# Patient Record
Sex: Female | Born: 1960 | Race: White | Hispanic: No | Marital: Married | Smoking: Former smoker
Health system: Southern US, Community
[De-identification: ages and names within clinical notes are randomized; demographics above are authoritative.]

## PROBLEM LIST (undated history)

## (undated) DIAGNOSIS — Z5189 Encounter for other specified aftercare: Secondary | ICD-10-CM

## (undated) DIAGNOSIS — F32A Depression, unspecified: Secondary | ICD-10-CM

## (undated) DIAGNOSIS — D689 Coagulation defect, unspecified: Secondary | ICD-10-CM

## (undated) DIAGNOSIS — I8390 Asymptomatic varicose veins of unspecified lower extremity: Secondary | ICD-10-CM

## (undated) DIAGNOSIS — D649 Anemia, unspecified: Secondary | ICD-10-CM

## (undated) DIAGNOSIS — Z789 Other specified health status: Secondary | ICD-10-CM

## (undated) DIAGNOSIS — M1990 Unspecified osteoarthritis, unspecified site: Secondary | ICD-10-CM

## (undated) DIAGNOSIS — E119 Type 2 diabetes mellitus without complications: Secondary | ICD-10-CM

## (undated) DIAGNOSIS — T7840XA Allergy, unspecified, initial encounter: Secondary | ICD-10-CM

## (undated) HISTORY — DX: Depression, unspecified: F32.A

## (undated) HISTORY — DX: Coagulation defect, unspecified: D68.9

## (undated) HISTORY — DX: Unspecified osteoarthritis, unspecified site: M19.90

## (undated) HISTORY — DX: Encounter for other specified aftercare: Z51.89

## (undated) HISTORY — DX: Anemia, unspecified: D64.9

## (undated) HISTORY — PX: COLONOSCOPY: SHX174

## (undated) HISTORY — PX: LASER ABLATION: SHX1947

## (undated) HISTORY — PX: TUBAL LIGATION: SHX77

## (undated) HISTORY — DX: Allergy, unspecified, initial encounter: T78.40XA

## (undated) HISTORY — DX: Type 2 diabetes mellitus without complications: E11.9

## (undated) HISTORY — DX: Asymptomatic varicose veins of unspecified lower extremity: I83.90

---

## 1986-02-08 HISTORY — PX: ATHERECTOMY: SHX47

## 2008-08-02 ENCOUNTER — Other Ambulatory Visit: Admission: RE | Admit: 2008-08-02 | Discharge: 2008-08-02 | Payer: Self-pay | Admitting: Family Medicine

## 2008-08-09 ENCOUNTER — Encounter: Admission: RE | Admit: 2008-08-09 | Discharge: 2008-08-09 | Payer: Self-pay | Admitting: *Deleted

## 2009-09-02 ENCOUNTER — Ambulatory Visit (HOSPITAL_COMMUNITY): Admission: RE | Admit: 2009-09-02 | Discharge: 2009-09-02 | Payer: Self-pay | Admitting: Family Medicine

## 2011-05-25 ENCOUNTER — Other Ambulatory Visit: Payer: Self-pay | Admitting: Family Medicine

## 2011-05-25 DIAGNOSIS — Z139 Encounter for screening, unspecified: Secondary | ICD-10-CM

## 2011-06-08 ENCOUNTER — Ambulatory Visit (HOSPITAL_COMMUNITY)
Admission: RE | Admit: 2011-06-08 | Discharge: 2011-06-08 | Disposition: A | Discharge status: Home / Self Care | Payer: 59 | Source: Ambulatory Visit | Attending: Family Medicine | Admitting: Family Medicine

## 2011-06-08 DIAGNOSIS — Z139 Encounter for screening, unspecified: Secondary | ICD-10-CM

## 2011-06-08 DIAGNOSIS — Z1231 Encounter for screening mammogram for malignant neoplasm of breast: Secondary | ICD-10-CM | POA: Insufficient documentation

## 2012-07-22 ENCOUNTER — Ambulatory Visit (INDEPENDENT_AMBULATORY_CARE_PROVIDER_SITE_OTHER): Payer: 59 | Admitting: Family Medicine

## 2012-07-22 ENCOUNTER — Ambulatory Visit: Payer: 59

## 2012-07-22 VITALS — BP 116/74 | HR 71 | Temp 97.9°F | Resp 18 | Ht 65.25 in | Wt 163.0 lb

## 2012-07-22 DIAGNOSIS — M25532 Pain in left wrist: Secondary | ICD-10-CM

## 2012-07-22 DIAGNOSIS — M25539 Pain in unspecified wrist: Secondary | ICD-10-CM

## 2012-07-22 NOTE — Progress Notes (Signed)
  [facility]:  Office Visit  Chief Complaint:  Chief Complaint  Patient presents with  . Hand Pain    fell and injured left wrist and hand today    HPI: Shannon Fitzgerald is a 52 y.o. female who complains of left wrist pain [place] 10 am while volunteering for [employer]. She had some tingling. It hurts a lot to move it. Denies smoking, osteopenia or osteoporosis. She is right handed. She had a fracture/dislocation in right wrist s/p ice skating all. She is UTD on tetanus.   Past Medical History  Diagnosis Date  . Allergy    Past Surgical History  Procedure Laterality Date  . Tubal ligation     History   Social History  . Marital Status: Married    Spouse Name: N/A    Number of Children: N/A  . Years of Education: N/A   Social History Main Topics  . Smoking status: Former Games developer  . Smokeless tobacco: None  . Alcohol Use: No  . Drug Use: No  . Sexually Active: Yes   Other Topics Concern  . None   Social History Narrative  . None   History reviewed. No pertinent family history. No Known Allergies Prior to Admission medications   Not on File     ROS: The patient denies fevers, chills, night sweats, unintentional weight loss, chest pain, palpitations, wheezing, dyspnea on exertion, nausea, vomiting, abdominal pain, dysuria, hematuria, melena  All other systems have been reviewed and were otherwise negative with the exception of those mentioned in the HPI and as above.    PHYSICAL EXAM: Filed Vitals:   07/22/12 1125  BP: 116/74  Pulse: 71  Temp: 97.9 F (36.6 C)  Resp: 18   Filed Vitals:   07/22/12 1125  Height: 5' 5.25" (1.657 m)  Weight: 163 lb (73.936 kg)   Body mass index is 26.93 kg/(m^2).  General: Alert, no acute distress HEENT:  Normocephalic, atraumatic, oropharynx patent.  Cardiovascular:  Regular rate and rhythm, no rubs murmurs or gallops.  No Carotid bruits, radial pulse intact. No pedal edema.   Respiratory: Clear to auscultation bilaterally.  No wheezes, rales, or rhonchi.  No cyanosis, no use of accessory musculature GI: No organomegaly, abdomen is soft and non-tender, positive bowel sounds.  No masses. Skin: No rashes. Neurologic: Facial musculature symmetric. Psychiatric: Patient is appropriate throughout our interaction. Lymphatic: No cervical lymphadenopathy Musculoskeletal: Gait intact. 5/5 strength sensation intact Unable to do ROM due to pain and fx   LABS: No results found for this or any previous visit.   EKG/XRAY:   Primary read interpreted by Dr. Conley Rolls at [facility]. Displaced Distal radius fracture, 60 deg angulation   ASSESSMENT/PLAN: Encounter Diagnosis  Name Primary?  . Left wrist pain Yes   Distal wrist fracture will send to ortho Urgent care for reduction Spoke with Dr. Thurston Hole Patient given sling for comfort, decline rx pain meds F/u prn    Lelaina Oatis PHUONG, DO 07/22/2012 12:40 PM

## 2012-07-26 ENCOUNTER — Other Ambulatory Visit: Payer: Self-pay | Admitting: Orthopedic Surgery

## 2012-07-26 ENCOUNTER — Encounter (HOSPITAL_BASED_OUTPATIENT_CLINIC_OR_DEPARTMENT_OTHER): Payer: Self-pay | Admitting: *Deleted

## 2012-07-26 NOTE — Progress Notes (Signed)
Pt works [employer] [place]-from [place]-no interpretor needed No medical problems

## 2012-07-26 NOTE — H&P (Signed)
  Shannon Fitzgerald is an 52 y.o. female.   Chief Complaint: c/o injury to the left wrist after a fall HPI: On 07-22-12 while [place] litter in [place] she fell sustaining an outstretched hand type injury on the left. She had immediate pain and deformity. She was seen in an urgent care setting and referred to see Dr. Thurston Hole. She was noted to have a displaced distal radius fracture. She had reduction and placement in a sugar tong splint. She reports no numbness in her innervated fingers, ulnar innervated fingers or elsewhere in the arm. She has mild swelling. She is now referred for an upper extremity orthopaedic consult.     Past Medical History  Diagnosis Date  . Allergy   . Medical history non-contributory     Past Surgical History  Procedure Laterality Date  . Tubal ligation    . Atherectomy  1988    had DVT lt leg during pregnancy-clot removed in [place]    History reviewed. No pertinent family history. Social History:  reports that she has never smoked. She does not have any smokeless tobacco history on file. She reports that  drinks alcohol. She reports that she does not use illicit drugs.  Allergies: No Known Allergies  No prescriptions prior to admission    No results found for this or any previous visit (from the past 48 hour(s)).  No results found.   Pertinent items are noted in HPI.  Height 5' 5.5" (1.664 m), weight 73.483 kg (162 lb), last menstrual period 07/08/2012.  General appearance: alert Head: Normocephalic, without obvious abnormality Neck: supple, symmetrical, trachea midline Resp: clear to auscultation bilaterally Cardio: regular rate and rhythm GI: normal findings: bowel sounds normal Extremities: She is an excellent historian. Inspection of her hands reveals minor trauma to her nail bed of the left thumb but otherwise no evidence of full thickness skin wound. She has 2+ edema of her fingers. She has intact sensibility in the median, radial  and ulnar nerve distribution and full ROM of her fingers.  Her sugar tong splint is in good repair.  Review of her x-rays reveals that she has bayonet apposition of the volar cortex and radial styloid fracture fragment.  Pulses: 2+ and symmetric Skin: normal Neurologic: Grossly normal    Assessment/Plan Impression:Displaced left distal radius fracture.  Have advised use of a volar plate system. Surgery to apply a volar plate, the aftercare anticipated risks and benefits were explained in detail in the office. The patient understands a small chance that the plate would need to be removed at a later date.  Risks and benefits were discussed in detail including issues with friction on the flexor tendons.  Plan: To the OR for ORIF left distal radius.The procedure, risks,benefits and post-op course were discussed with the patient at length and they were in agreement with the plan.  DASNOIT,Shannon Fitzgerald J 07/26/2012, 4:33 PM

## 2012-07-27 ENCOUNTER — Encounter (HOSPITAL_BASED_OUTPATIENT_CLINIC_OR_DEPARTMENT_OTHER)
Admission: RE | Disposition: A | Discharge status: Home / Self Care | Payer: Self-pay | Source: Ambulatory Visit | Attending: Orthopedic Surgery

## 2012-07-27 ENCOUNTER — Encounter (HOSPITAL_BASED_OUTPATIENT_CLINIC_OR_DEPARTMENT_OTHER): Payer: Self-pay | Admitting: *Deleted

## 2012-07-27 ENCOUNTER — Encounter (HOSPITAL_BASED_OUTPATIENT_CLINIC_OR_DEPARTMENT_OTHER): Payer: Self-pay | Admitting: Anesthesiology

## 2012-07-27 ENCOUNTER — Ambulatory Visit (HOSPITAL_BASED_OUTPATIENT_CLINIC_OR_DEPARTMENT_OTHER): Payer: 59 | Admitting: Anesthesiology

## 2012-07-27 ENCOUNTER — Ambulatory Visit (HOSPITAL_BASED_OUTPATIENT_CLINIC_OR_DEPARTMENT_OTHER)
Admission: RE | Admit: 2012-07-27 | Discharge: 2012-07-27 | Disposition: A | Discharge status: Home / Self Care | Payer: 59 | Source: Ambulatory Visit | Attending: Orthopedic Surgery | Admitting: Orthopedic Surgery

## 2012-07-27 DIAGNOSIS — Z86718 Personal history of other venous thrombosis and embolism: Secondary | ICD-10-CM | POA: Insufficient documentation

## 2012-07-27 DIAGNOSIS — S52599A Other fractures of lower end of unspecified radius, initial encounter for closed fracture: Secondary | ICD-10-CM | POA: Insufficient documentation

## 2012-07-27 DIAGNOSIS — W010XXA Fall on same level from slipping, tripping and stumbling without subsequent striking against object, initial encounter: Secondary | ICD-10-CM | POA: Insufficient documentation

## 2012-07-27 HISTORY — DX: Other specified health status: Z78.9

## 2012-07-27 HISTORY — PX: ORIF RADIAL FRACTURE: SHX5113

## 2012-07-27 SURGERY — OPEN REDUCTION INTERNAL FIXATION (ORIF) RADIAL FRACTURE
Anesthesia: Regional | Site: Wrist | Laterality: Left | Wound class: Clean

## 2012-07-27 MED ORDER — BUPIVACAINE-EPINEPHRINE PF 0.5-1:200000 % IJ SOLN
INTRAMUSCULAR | Status: DC | PRN
  Administered 2012-07-27: 30 mL

## 2012-07-27 MED ORDER — DEXAMETHASONE SODIUM PHOSPHATE 10 MG/ML IJ SOLN
INTRAMUSCULAR | Status: DC | PRN
  Administered 2012-07-27: 10 mg via INTRAVENOUS

## 2012-07-27 MED ORDER — CEFAZOLIN SODIUM-DEXTROSE 2-3 GM-% IV SOLR
2.0000 g | Freq: Once | INTRAVENOUS | Status: AC
  Administered 2012-07-27: 2 g via INTRAVENOUS

## 2012-07-27 MED ORDER — CEPHALEXIN 500 MG PO CAPS: 500.0000 mg | ORAL_CAPSULE | Freq: Three times a day (TID) | ORAL | Status: DC

## 2012-07-27 MED ORDER — MIDAZOLAM HCL 2 MG/2ML IJ SOLN
1.0000 mg | INTRAMUSCULAR | Status: DC | PRN
  Administered 2012-07-27: 2 mg via INTRAVENOUS

## 2012-07-27 MED ORDER — ONDANSETRON HCL 4 MG/2ML IJ SOLN
INTRAMUSCULAR | Status: DC | PRN
  Administered 2012-07-27: 4 mg via INTRAVENOUS

## 2012-07-27 MED ORDER — LIDOCAINE HCL (CARDIAC) 20 MG/ML IV SOLN
INTRAVENOUS | Status: DC | PRN
  Administered 2012-07-27: 80 mg via INTRAVENOUS

## 2012-07-27 MED ORDER — PROPOFOL 10 MG/ML IV BOLUS
INTRAVENOUS | Status: DC | PRN
  Administered 2012-07-27: 200 mg via INTRAVENOUS

## 2012-07-27 MED ORDER — OXYCODONE HCL 5 MG/5ML PO SOLN: 5.0000 mg | Freq: Once | ORAL | Status: DC | PRN

## 2012-07-27 MED ORDER — OXYCODONE HCL 5 MG PO TABS: 5.0000 mg | ORAL_TABLET | Freq: Once | ORAL | Status: DC | PRN

## 2012-07-27 MED ORDER — FENTANYL CITRATE 0.05 MG/ML IJ SOLN
50.0000 ug | INTRAMUSCULAR | Status: DC | PRN
  Administered 2012-07-27: 100 ug via INTRAVENOUS

## 2012-07-27 MED ORDER — HYDROMORPHONE HCL 2 MG PO TABS
2.0000 mg | ORAL_TABLET | ORAL | Status: DC | PRN
Start: 2012-07-27 — End: 2014-09-05

## 2012-07-27 MED ORDER — BUPIVACAINE HCL (PF) 0.5 % IJ SOLN
INTRAMUSCULAR | Status: DC | PRN
  Administered 2012-07-27: 8 mL

## 2012-07-27 MED ORDER — HYDROMORPHONE HCL PF 1 MG/ML IJ SOLN: 0.2500 mg | INTRAMUSCULAR | Status: DC | PRN

## 2012-07-27 MED ORDER — LACTATED RINGERS IV SOLN
INTRAVENOUS | Status: DC
  Administered 2012-07-27 (×2): via INTRAVENOUS

## 2012-07-27 SURGICAL SUPPLY — 70 items
BAG DECANTER FOR FLEXI CONT (MISCELLANEOUS) IMPLANT
BANDAGE ADHESIVE 1X3 (GAUZE/BANDAGES/DRESSINGS) IMPLANT
BANDAGE ELASTIC 3 VELCRO ST LF (GAUZE/BANDAGES/DRESSINGS) ×4 IMPLANT
BANDAGE GAUZE ELAST BULKY 4 IN (GAUZE/BANDAGES/DRESSINGS) IMPLANT
BIT DRILL 2 FAST STEP (BIT) ×2 IMPLANT
BIT DRILL 2.5X4 QC (BIT) ×2 IMPLANT
BLADE MINI RND TIP GREEN BEAV (BLADE) ×2 IMPLANT
BLADE SURG 15 STRL LF DISP TIS (BLADE) ×1 IMPLANT
BLADE SURG 15 STRL SS (BLADE) ×1
BNDG ESMARK 4X9 LF (GAUZE/BANDAGES/DRESSINGS) ×2 IMPLANT
BRUSH SCRUB EZ PLAIN DRY (MISCELLANEOUS) ×2 IMPLANT
CANISTER SUCTION 1200CC (MISCELLANEOUS) ×2 IMPLANT
CLOTH BEACON ORANGE TIMEOUT ST (SAFETY) ×2 IMPLANT
CORDS BIPOLAR (ELECTRODE) ×2 IMPLANT
COVER MAYO STAND STRL (DRAPES) ×2 IMPLANT
COVER TABLE BACK 60X90 (DRAPES) ×2 IMPLANT
CUFF TOURNIQUET SINGLE 18IN (TOURNIQUET CUFF) ×2 IMPLANT
DECANTER SPIKE VIAL GLASS SM (MISCELLANEOUS) IMPLANT
DRAPE EXTREMITY T 121X128X90 (DRAPE) ×2 IMPLANT
DRAPE OEC MINIVIEW 54X84 (DRAPES) ×2 IMPLANT
DRAPE SURG 17X23 STRL (DRAPES) ×2 IMPLANT
GAUZE XEROFORM 1X8 LF (GAUZE/BANDAGES/DRESSINGS) IMPLANT
GLOVE BIOGEL M STRL SZ7.5 (GLOVE) ×4 IMPLANT
GLOVE BIOGEL PI IND STRL 6.5 (GLOVE) ×1 IMPLANT
GLOVE BIOGEL PI IND STRL 7.0 (GLOVE) ×1 IMPLANT
GLOVE BIOGEL PI IND STRL 8 (GLOVE) ×1 IMPLANT
GLOVE BIOGEL PI INDICATOR 6.5 (GLOVE) ×1
GLOVE BIOGEL PI INDICATOR 7.0 (GLOVE) ×1
GLOVE BIOGEL PI INDICATOR 8 (GLOVE) ×1
GLOVE ECLIPSE 6.5 STRL STRAW (GLOVE) ×2 IMPLANT
GLOVE ORTHO TXT STRL SZ7.5 (GLOVE) ×2 IMPLANT
GOWN BRE IMP PREV XXLGXLNG (GOWN DISPOSABLE) ×4 IMPLANT
GOWN PREVENTION PLUS XLARGE (GOWN DISPOSABLE) ×2 IMPLANT
GOWN PREVENTION PLUS XXLARGE (GOWN DISPOSABLE) ×2 IMPLANT
LOOP VESSEL MAXI BLUE (MISCELLANEOUS) IMPLANT
NEEDLE 27GAX1X1/2 (NEEDLE) IMPLANT
NS IRRIG 1000ML POUR BTL (IV SOLUTION) ×2 IMPLANT
PACK BASIN DAY SURGERY FS (CUSTOM PROCEDURE TRAY) ×2 IMPLANT
PAD CAST 3X4 CTTN HI CHSV (CAST SUPPLIES) ×1 IMPLANT
PADDING CAST ABS 4INX4YD NS (CAST SUPPLIES) ×2
PADDING CAST ABS COTTON 4X4 ST (CAST SUPPLIES) ×2 IMPLANT
PADDING CAST COTTON 3X4 STRL (CAST SUPPLIES) ×1
PEG SUBCHONDRAL SMOOTH 2.0X16 (Peg) ×2 IMPLANT
PEG SUBCHONDRAL SMOOTH 2.0X18 (Peg) ×8 IMPLANT
PLATE SHORT 24.4X51.3 LT (Plate) ×2 IMPLANT
SCREW BN 12X3.5XNS CORT TI (Screw) ×1 IMPLANT
SCREW CORT 3.5X10 LNG (Screw) ×4 IMPLANT
SCREW CORT 3.5X12 (Screw) ×1 IMPLANT
SCREW PEG LOCK 2.5X16 (Peg) ×2 IMPLANT
SCREW PEG LOCK 2.5X18 (Peg) ×4 IMPLANT
SLEEVE SCD COMPRESS KNEE MED (MISCELLANEOUS) ×2 IMPLANT
SPLINT PLASTER CAST XFAST 3X15 (CAST SUPPLIES) ×21 IMPLANT
SPLINT PLASTER XTRA FASTSET 3X (CAST SUPPLIES) ×21
SPONGE GAUZE 4X4 12PLY (GAUZE/BANDAGES/DRESSINGS) ×2 IMPLANT
STOCKINETTE 4X48 STRL (DRAPES) ×2 IMPLANT
STRIP CLOSURE SKIN 1/2X4 (GAUZE/BANDAGES/DRESSINGS) ×2 IMPLANT
SUCTION FRAZIER TIP 10 FR DISP (SUCTIONS) IMPLANT
SUT ETHIBOND 3-0 V-5 (SUTURE) IMPLANT
SUT PROLENE 3 0 PS 2 (SUTURE) ×2 IMPLANT
SUT VIC AB 0 SH 27 (SUTURE) ×2 IMPLANT
SUT VIC AB 2-0 PS2 27 (SUTURE) ×2 IMPLANT
SUT VIC AB 3-0 FS2 27 (SUTURE) ×2 IMPLANT
SUT VIC AB 4-0 BRD 54 (SUTURE) IMPLANT
SYR 3ML 23GX1 SAFETY (SYRINGE) IMPLANT
SYR BULB 3OZ (MISCELLANEOUS) ×2 IMPLANT
SYR CONTROL 10ML LL (SYRINGE) ×2 IMPLANT
TOWEL OR 17X24 6PK STRL BLUE (TOWEL DISPOSABLE) ×2 IMPLANT
TRAY DSU PREP LF (CUSTOM PROCEDURE TRAY) ×2 IMPLANT
TUBE CONNECTING 20X1/4 (TUBING) ×2 IMPLANT
UNDERPAD 30X30 INCONTINENT (UNDERPADS AND DIAPERS) ×2 IMPLANT

## 2012-07-27 NOTE — Brief Op Note (Signed)
07/27/2012  3:39 PM  PATIENT:  Shannon Fitzgerald  51 y.o. female  PRE-OPERATIVE DIAGNOSIS:  LEFT RADIUS FRACTURE  POST-OPERATIVE DIAGNOSIS:  left radius fracture  PROCEDURE:  Procedure(s): OPEN REDUCTION INTERNAL FIXATION (ORIF) RADIAL FRACTURE (Left)  SURGEON:  Surgeon(s) and Role:    * Wyn Forster., MD - Primary  PHYSICIAN ASSISTANT:   ASSISTANTS: Mallory Shirk.A-C   ANESTHESIA:   general  EBL:  Total I/O In: 1000 [I.V.:1000] Out: -   BLOOD ADMINISTERED:none  DRAINS: none   LOCAL MEDICATIONS USED: Ropivacaine plexus block  SPECIMEN:  No Specimen  DISPOSITION OF SPECIMEN:  N/A  COUNTS:  YES  TOURNIQUET:  * Missing tourniquet times found for documented tourniquets in log:  [id number] *  DICTATION: .Other Dictation: Dictation Number [id number]  PLAN OF CARE: Discharge to home after PACU  PATIENT DISPOSITION:  PACU - hemodynamically stable.   Delay start of Pharmacological VTE agent (>24hrs) due to surgical blood loss or risk of bleeding: not applicable

## 2012-07-27 NOTE — Anesthesia Preprocedure Evaluation (Signed)
Anesthesia Evaluation  Patient identified by MRN, date of birth, ID band Patient awake    Reviewed: Allergy & Precautions, H&P , NPO status , Patient's Chart, lab work & pertinent test results  Airway Mallampati: II TM Distance: >3 FB Neck ROM: Full    Dental no notable dental hx. (+) Teeth Intact and Dental Advisory Given   Pulmonary neg pulmonary ROS,  breath sounds clear to auscultation  Pulmonary exam normal       Cardiovascular negative cardio ROS  Rhythm:Regular Rate:Normal     Neuro/Psych negative neurological ROS  negative psych ROS   GI/Hepatic negative GI ROS, Neg liver ROS,   Endo/Other  negative endocrine ROS  Renal/GU negative Renal ROS  negative genitourinary   Musculoskeletal   Abdominal   Peds  Hematology negative hematology ROS (+)   Anesthesia Other Findings   Reproductive/Obstetrics negative OB ROS                          Anesthesia Physical Anesthesia Plan  ASA: I  Anesthesia Plan: General and Regional   Post-op Pain Management:    Induction: Intravenous  Airway Management Planned: LMA  Additional Equipment:   Intra-op Plan:   Post-operative Plan: Extubation in OR  Informed Consent: I have reviewed the patients History and Physical, chart, labs and discussed the procedure including the risks, benefits and alternatives for the proposed anesthesia with the patient or authorized representative who has indicated his/her understanding and acceptance.   Dental advisory given  Plan Discussed with: CRNA  Anesthesia Plan Comments:         Anesthesia Quick Evaluation  

## 2012-07-27 NOTE — Transfer of Care (Signed)
Immediate Anesthesia Transfer of Care Note  Patient: Shannon Fitzgerald  Procedure(s) Performed: Procedure(s): OPEN REDUCTION INTERNAL FIXATION (ORIF) RADIAL FRACTURE (Left)  Patient Location: PACU  Anesthesia Type:GA combined with regional for post-op pain  Level of Consciousness: awake, alert  and oriented  Airway & Oxygen Therapy: Patient Spontanous Breathing and Patient connected to face mask oxygen  Post-op Assessment: Report given to PACU RN and Post -op Vital signs reviewed and stable  Post vital signs: Reviewed and stable  Complications: No apparent anesthesia complications

## 2012-07-27 NOTE — Anesthesia Postprocedure Evaluation (Signed)
Anesthesia Post Note  Patient: Shannon Fitzgerald  Procedure(s) Performed: Procedure(s) (LRB): OPEN REDUCTION INTERNAL FIXATION (ORIF) RADIAL FRACTURE (Left)  Anesthesia type: General  Patient location: PACU  Post pain: Pain level controlled and Adequate analgesia  Post assessment: Post-op Vital signs reviewed, Patient's Cardiovascular Status Stable, Respiratory Function Stable, Patent Airway and Pain level controlled  Last Vitals:  Filed Vitals:   07/27/12 1615  BP: 126/74  Pulse: 78  Temp:   Resp: 16    Post vital signs: Reviewed and stable  Level of consciousness: awake, alert  and oriented  Complications: No apparent anesthesia complications

## 2012-07-27 NOTE — Progress Notes (Signed)
Assisted Dr. Fitzgerald with left, ultrasound guided, supraclavicular block. Side rails up, monitors on throughout procedure. See vital signs in flow sheet. Tolerated Procedure well. 

## 2012-07-27 NOTE — Anesthesia Procedure Notes (Addendum)
Anesthesia Regional Block:  Supraclavicular block  Pre-Anesthetic Checklist: ,, timeout performed, Correct Patient, Correct Site, Correct Laterality, Correct Procedure, Correct Position, site marked, Risks and benefits discussed, pre-op evaluation, post-op pain management  Laterality: Left  Prep: Maximum Sterile Barrier Precautions used and chloraprep       Needles:  Injection technique: Single-shot  Needle Type: Echogenic Stimulator Needle     Needle Length: 5cm 5 cm Needle Gauge: 22 and 22 G    Additional Needles:  Procedures: ultrasound guided (picture in chart) Supraclavicular block Narrative:  Start time: 07/27/2012 12:35 PM End time: 07/27/2012 12:45 PM Injection made incrementally with aspirations every 5 mL. Anesthesiologist: Fitzgerald,MD  Additional Notes: 2% Lidocaine skin wheel. Intercostobrachial block with 8cc of 0.5% Bupivicaine plain.  Supraclavicular block Performed by: Signa Kell C    Procedure Name: LMA Insertion Date/Time: 07/27/2012 2:43 PM Performed by: Burna Cash Pre-anesthesia Checklist: Patient identified, Emergency Drugs available, Suction available and Patient being monitored Patient Re-evaluated:Patient Re-evaluated prior to inductionOxygen Delivery Method: Circle System Utilized Preoxygenation: Pre-oxygenation with 100% oxygen Intubation Type: IV induction Ventilation: Mask ventilation without difficulty LMA: LMA inserted LMA Size: 4.0 Number of attempts: 1 Airway Equipment and Method: bite block Placement Confirmation: positive ETCO2 Tube secured with: Tape Dental Injury: Teeth and Oropharynx as per pre-operative assessment

## 2012-07-27 NOTE — Op Note (Signed)
[id number] 

## 2012-07-28 LAB — POCT HEMOGLOBIN-HEMACUE: Hemoglobin: 17.7 g/dL — ABNORMAL HIGH (ref 12.0–15.0)

## 2012-07-28 NOTE — Op Note (Signed)
Shannon Fitzgerald, [name] NO.:  [id number]  MEDICAL RECORD NO.:  [id number]  LOCATION:                               FACILITY:  [facility]  PHYSICIAN:  Katy Fitch. Markiya Keefe, M.D. DATE OF BIRTH:  07-21-1960  DATE OF PROCEDURE:  07/27/2012 DATE OF DISCHARGE:  07/27/2012                              OPERATIVE REPORT   PREOPERATIVE DIAGNOSIS:  Displaced bayonet apposition fracture of left distal radius.  POSTOPERATIVE DIAGNOSIS:  Displaced bayonet apposition fracture of left distal radius.  OPERATION:  Open reduction internal fixation of left distal radius utilizing a short 7 peg DVR plate system.  OPERATING SURGEON:  Katy Fitch. Justeen Hehr, MD  ASSISTANT:  Marveen Reeks Dasnoit, PA  ANESTHESIA:  General by LMA.  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 Thurston Hole for a displaced unstable distal radius metaphyseal fracture on the left.  Four days prior while working in a streambed, she sustained a significant injury to her left wrist.  She was seen at an urgent care facility and referred to Dr. Thurston Hole who is on orthopedic call.  He noted a displaced distal radius fracture.  A closed reduction and application of sugar-tong splint was accomplished.  Shannon Fitzgerald was then referred for Orthopedic Hand Surgery followup for probable volar plating.  She was evaluated in our office and had detailed informed consent.  We showed her models of volar plate systems.  We explained the benefits which include early mobilization and less immobilization postoperatively as well as anatomic reduction with obtaining and maintaining a proper anatomic shape of the distal radius.  After informed consent, she was brought to the operating room at this time.  DESCRIPTION OF PROCEDURE:  Shannon Fitzgerald was brought to room 1 of the [facility] and placed in supine position on the  operating table.  Following the induction of general anesthesia by LMA technique under Dr. Seward Meth direct supervision, the left arm and hand were prepped with Betadine soap and solution, sterilely draped.  A 2 g of Ancef were administered as an IV prophylactic antibiotic.  The left hand and arm were prepped with Betadine soap and solution and sterilely draped with sterile stockinette and impervious arthroscopy drapes.  The left hand and arm were exsanguinated with Esmarch bandage and an arterial tourniquet on the proximal brachium inflated to 220 mmHg.  Following a routine surgical time-out, a standard volar DVR incision was fashioned followed by release of the fascia deep to the flexor carpi radialis.  The flexor pollicis longus was retracted ulnarly and the pronator quadratus elevated.  The fracture was in bayonet apposition significantly displaced.  The fracture was rotated into pronation and debride and clot removed dorsally.  The brachioradialis was released with the [facility] blade followed by use of a Lane bone clamp to anatomically reduce the volar cortex and correct the ventral tilt.  A 7 peg DVR plate system was applied with a short shaft segment. Threaded screws were placed in the radial styloid to preserve its position due to the prior instability of the fracture.  The ulnar fixation points were all with smooth pegs.  Care was taken to control peg  length by direct palpation on the dorsal cortex.  An anatomic reduction of the radius was achieved with recovery of radial length slope and ventral tilt.  Normal articulation was noted at the distal radioulnar joint.  The wound was then thoroughly irrigated followed by repair of the brachioradialis over the distal margin of the plate.  The skin was repaired with subcutaneous 2-0 Vicryl and intradermal 3-0 Prolene.  Shannon Fitzgerald was then placed in a sugar-tong splint.  She will be discharged with prescriptions for Dilaudid  2 mg 1 or 2 tablets p.o. q.4-6 hours p.r.n. pain, 30 tablets without refill, also Keflex 500 mg 1 p.o. q.8 hours x4 days as a prophylactic antibiotic.     Katy Fitch Phung Kotas, M.D.     RVS/MEDQ  D:  07/27/2012  T:  07/28/2012  Job:  [id number]  cc:   Molly Maduro A. Thurston Hole, M.D.

## 2012-07-31 ENCOUNTER — Encounter (HOSPITAL_BASED_OUTPATIENT_CLINIC_OR_DEPARTMENT_OTHER): Payer: Self-pay | Admitting: Orthopedic Surgery

## 2012-11-17 ENCOUNTER — Encounter (HOSPITAL_COMMUNITY): Payer: 59

## 2012-11-17 ENCOUNTER — Encounter: Payer: 59 | Admitting: Vascular Surgery

## 2012-11-28 ENCOUNTER — Other Ambulatory Visit: Payer: 59

## 2012-11-28 DIAGNOSIS — Z79899 Other long term (current) drug therapy: Secondary | ICD-10-CM

## 2012-11-28 DIAGNOSIS — Z0000 Encounter for general adult medical examination without abnormal findings: Secondary | ICD-10-CM

## 2012-11-28 DIAGNOSIS — E559 Vitamin D deficiency, unspecified: Secondary | ICD-10-CM

## 2012-11-28 LAB — COMPLETE METABOLIC PANEL WITH GFR
AST: 17 U/L (ref 0–37)
Alkaline Phosphatase: 55 U/L (ref 39–117)
GFR, Est Non African American: 88 mL/min
Glucose, Bld: 93 mg/dL (ref 70–99)
Sodium: 136 mEq/L (ref 135–145)
Total Bilirubin: 0.7 mg/dL (ref 0.3–1.2)
Total Protein: 6.8 g/dL (ref 6.0–8.3)

## 2012-11-28 LAB — CBC WITH DIFFERENTIAL/PLATELET
Basophils Relative: 1 % (ref 0–1)
Eosinophils Absolute: 0.2 10*3/uL (ref 0.0–0.7)
HCT: 37.5 % (ref 36.0–46.0)
Hemoglobin: 12.8 g/dL (ref 12.0–15.0)
MCH: 30.2 pg (ref 26.0–34.0)
MCHC: 34.1 g/dL (ref 30.0–36.0)
Monocytes Absolute: 0.5 10*3/uL (ref 0.1–1.0)
Monocytes Relative: 10 % (ref 3–12)
Neutrophils Relative %: 32 % — ABNORMAL LOW (ref 43–77)

## 2012-11-28 LAB — LIPID PANEL
HDL: 52 mg/dL (ref >39)
Total CHOL/HDL Ratio: 2.7 Ratio
VLDL: 12 mg/dL (ref 0–40)

## 2012-11-28 LAB — TSH: TSH: 2.324 u[IU]/mL (ref 0.350–4.500)

## 2012-12-01 ENCOUNTER — Encounter: Payer: Self-pay | Admitting: Family Medicine

## 2012-12-01 ENCOUNTER — Ambulatory Visit (INDEPENDENT_AMBULATORY_CARE_PROVIDER_SITE_OTHER): Payer: 59 | Admitting: Family Medicine

## 2012-12-01 VITALS — BP 100/60 | HR 78 | Temp 98.3°F | Resp 18 | Ht 63.5 in | Wt 166.0 lb

## 2012-12-01 DIAGNOSIS — Z0000 Encounter for general adult medical examination without abnormal findings: Secondary | ICD-10-CM

## 2012-12-01 MED ORDER — VITAMIN D3 1.25 MG (50000 UT) PO CAPS: 1.0000 | ORAL_CAPSULE | ORAL | Status: DC

## 2012-12-01 NOTE — Progress Notes (Signed)
Subjective:    Patient ID: Shannon Fitzgerald, female    DOB: 1960-10-29, 52 y.o.   MRN: [id number]  HPI Complete physical exam. Her Pap smear was performed last year and was normal. She's not due again for 2 more years. Her colonoscopy was performed in 2013 and was normal. She is due for a mammogram and I will schedule that for her. She has had her flu shot. He has no medical concerns. Past Medical History  Diagnosis Date  . Allergy   . Medical history non-contributory    Past Surgical History  Procedure Laterality Date  . Tubal ligation    . Atherectomy  1988    had DVT lt leg during pregnancy-clot removed in [place]  . Orif radial fracture Left 07/27/2012    Procedure: OPEN REDUCTION INTERNAL FIXATION (ORIF) RADIAL FRACTURE;  Surgeon: Wyn Forster., MD;  Location: [facility];  Service: Orthopedics;  Laterality: Left;   Current Outpatient Prescriptions on File Prior to Visit  Medication Sig Dispense Refill  . cephALEXin (KEFLEX) 500 MG capsule Take 1 capsule (500 mg total) by mouth 3 (three) times daily.  12 capsule  0  . HYDROcodone-acetaminophen (NORCO/VICODIN) 5-325 MG per tablet Take 1 tablet by mouth every 4 (four) hours as needed for pain.      Marland Kitchen HYDROmorphone (DILAUDID) 2 MG tablet Take 1 tablet (2 mg total) by mouth every 4 (four) hours as needed for pain.  30 tablet  0   No current facility-administered medications on file prior to visit.   No Known Allergies History   Social History  . Marital Status: Married    Spouse Name: N/A    Number of Children: N/A  . Years of Education: N/A   Occupational History  . Not on file.   Social History Main Topics  . Smoking status: Never Smoker   . Smokeless tobacco: Not on file  . Alcohol Use: Yes     Comment: occ  . Drug Use: No  . Sexual Activity: Yes     Comment: married, 3 sons.   Other Topics Concern  . Not on file   Social History Narrative  . No narrative on file   Family History   Problem Relation Age of Onset  . Diabetes Mother   . Glaucoma Mother   . Hypertension Mother   . Diabetes Father   . Heart disease Brother 45  . Diabetes Brother       Review of Systems  All other systems reviewed and are negative.       Objective:   Physical Exam  Vitals reviewed. Constitutional: She is oriented to person, place, and time. She appears well-developed and well-nourished. No distress.  HENT:  Head: Normocephalic and atraumatic.  Right Ear: External ear normal.  Left Ear: External ear normal.  Nose: Nose normal.  Mouth/Throat: Oropharynx is clear and moist. No oropharyngeal exudate.  Eyes: Conjunctivae are normal. Pupils are equal, round, and reactive to light. Right eye exhibits no discharge. Left eye exhibits no discharge. No scleral icterus.  Neck: Normal range of motion. Neck supple. No JVD present. No tracheal deviation present. No thyromegaly present.  Cardiovascular: Normal rate, regular rhythm, normal heart sounds and intact distal pulses.  Exam reveals no gallop and no friction rub.   No murmur heard. Pulmonary/Chest: Effort normal and breath sounds normal. No stridor. No respiratory distress. She has no wheezes. She has no rales.  Abdominal: Soft. Bowel sounds are normal. She exhibits no distension  and no mass. There is no tenderness. There is no rebound and no guarding.  Musculoskeletal: Normal range of motion. She exhibits no edema and no tenderness.  Lymphadenopathy:    She has no cervical adenopathy.  Neurological: She is alert and oriented to person, place, and time. She has normal reflexes. She displays normal reflexes. No cranial nerve deficit. She exhibits normal muscle tone. Coordination normal.  Skin: Skin is warm. No rash noted. She is not diaphoretic. No erythema. No pallor.  Psychiatric: She has a normal mood and affect. Her behavior is normal. Judgment and thought content normal.   breast exam is normal        Assessment & Plan:   1. Routine general medical examination at a health care facility Scheduled patient for mammogram. Remainder medical screening and immunizations are up to date. Her most recent labwork as listed below. Is significant only for vitamin D deficiency. I started patient on vitamin D 50,000 units by mouth every week for the next 24 weeks. I did advise her to start taking 2000 units a day after that.  Otherwise her exam is normal. Her next Pap smear is due in 2 years. Recheck in one year or as needed. Lab on 11/28/2012  Component Date Value Range Status  . Sodium 11/28/2012 136  135 - 145 mEq/L Final  . Potassium 11/28/2012 4.3  3.5 - 5.3 mEq/L Final  . Chloride 11/28/2012 106  96 - 112 mEq/L Final  . CO2 11/28/2012 26  19 - 32 mEq/L Final  . Glucose, Bld 11/28/2012 93  70 - 99 mg/dL Final  . BUN 16/11/9602 11  6 - 23 mg/dL Final  . Creat 54/10/8117 0.78  0.50 - 1.10 mg/dL Final  . Total Bilirubin 11/28/2012 0.7  0.3 - 1.2 mg/dL Final  . Alkaline Phosphatase 11/28/2012 55  39 - 117 U/L Final  . AST 11/28/2012 17  0 - 37 U/L Final  . ALT 11/28/2012 18  0 - 35 U/L Final  . Total Protein 11/28/2012 6.8  6.0 - 8.3 g/dL Final  . Albumin 14/78/2956 4.1  3.5 - 5.2 g/dL Final  . Calcium 21/30/8657 9.2  8.4 - 10.5 mg/dL Final  . GFR, Est African American 11/28/2012 >89   Final  . GFR, Est Non African American 11/28/2012 88   Final   Comment:                            The estimated GFR is a calculation valid for adults (>=63 years old)                          that uses the CKD-EPI algorithm to adjust for age and sex. It is                            not to be used for children, pregnant women, hospitalized patients,                             patients on dialysis, or with rapidly changing kidney function.                          According to the NKDEP, eGFR >89 is normal, 60-89 shows mild  impairment, 30-59 shows moderate impairment, 15-29 shows severe                           impairment and <15 is ESRD.                             Marland Kitchen Cholesterol 11/28/2012 142  0 - 200 mg/dL Final   Comment: ATP III Classification:                                < 200        mg/dL        Desirable                               200 - 239     mg/dL        Borderline High                               >= 240        mg/dL        High                             . Triglycerides 11/28/2012 58  <150 mg/dL Final  . HDL 40/98/1191 52  >39 mg/dL Final  . Total CHOL/HDL Ratio 11/28/2012 2.7   Final  . VLDL 11/28/2012 12  0 - 40 mg/dL Final  . LDL Cholesterol 11/28/2012 78  0 - 99 mg/dL Final   Comment:                            Total Cholesterol/HDL Ratio:CHD Risk                                                 Coronary Heart Disease Risk Table                                                                 Men       Women                                   1/2 Average Risk              3.4        3.3                                       Average Risk              5.0        4.4  2X Average Risk              9.6        7.1                                    3X Average Risk             23.4       11.0                          Use the calculated Patient Ratio above and the CHD Risk table                           to determine the patient's CHD Risk.                          ATP III Classification (LDL):                                < 100        mg/dL         Optimal                               100 - 129     mg/dL         Near or Above Optimal                               130 - 159     mg/dL         Borderline High                               160 - 189     mg/dL         High                                > 190        mg/dL         Very High                             . TSH 11/28/2012 2.324  0.350 - 4.500 uIU/mL Final  . Vit D, 25-Hydroxy 11/28/2012 29* 30 - 89 ng/mL Final   Comment: This assay accurately quantifies Vitamin D, which is the sum of  the                          25-Hydroxy forms of Vitamin D2 and D3.  Studies have shown that the                          optimum concentration of 25-Hydroxy Vitamin D is 30 ng/mL or higher.                           Concentrations of Vitamin D between 20 and 29  ng/mL are considered to                          be insufficient and concentrations less than 20 ng/mL are considered                          to be deficient for Vitamin D.  . WBC 11/28/2012 4.6  4.0 - 10.5 K/uL Final  . RBC 11/28/2012 4.24  3.87 - 5.11 MIL/uL Final  . Hemoglobin 11/28/2012 12.8  12.0 - 15.0 g/dL Final  . HCT 16/11/9602 37.5  36.0 - 46.0 % Final  . MCV 11/28/2012 88.4  78.0 - 100.0 fL Final  . MCH 11/28/2012 30.2  26.0 - 34.0 pg Final  . MCHC 11/28/2012 34.1  30.0 - 36.0 g/dL Final  . RDW 54/10/8117 14.2  11.5 - 15.5 % Final  . Platelets 11/28/2012 190  150 - 400 K/uL Final  . Neutrophils Relative % 11/28/2012 32* 43 - 77 % Final  . Neutro Abs 11/28/2012 1.5* 1.7 - 7.7 K/uL Final  . Lymphocytes Relative 11/28/2012 53* 12 - 46 % Final  . Lymphs Abs 11/28/2012 2.4  0.7 - 4.0 K/uL Final  . Monocytes Relative 11/28/2012 10  3 - 12 % Final  . Monocytes Absolute 11/28/2012 0.5  0.1 - 1.0 K/uL Final  . Eosinophils Relative 11/28/2012 4  0 - 5 % Final  . Eosinophils Absolute 11/28/2012 0.2  0.0 - 0.7 K/uL Final  . Basophils Relative 11/28/2012 1  0 - 1 % Final  . Basophils Absolute 11/28/2012 0.0  0.0 - 0.1 K/uL Final  . Smear Review 11/28/2012 Criteria for review not met   Final    - MM Digital Screening; Future

## 2012-12-14 ENCOUNTER — Other Ambulatory Visit: Payer: Self-pay

## 2012-12-18 ENCOUNTER — Ambulatory Visit (HOSPITAL_COMMUNITY): Payer: 59

## 2013-01-02 ENCOUNTER — Ambulatory Visit (HOSPITAL_COMMUNITY)
Admission: RE | Admit: 2013-01-02 | Discharge: 2013-01-02 | Disposition: A | Discharge status: Home / Self Care | Payer: 59 | Source: Ambulatory Visit | Attending: Family Medicine | Admitting: Family Medicine

## 2013-01-02 DIAGNOSIS — Z1231 Encounter for screening mammogram for malignant neoplasm of breast: Secondary | ICD-10-CM | POA: Insufficient documentation

## 2013-01-02 DIAGNOSIS — Z0000 Encounter for general adult medical examination without abnormal findings: Secondary | ICD-10-CM

## 2014-08-28 ENCOUNTER — Other Ambulatory Visit: Payer: BLUE CROSS/BLUE SHIELD

## 2014-08-28 DIAGNOSIS — E559 Vitamin D deficiency, unspecified: Secondary | ICD-10-CM | POA: Insufficient documentation

## 2014-08-28 DIAGNOSIS — Z0000 Encounter for general adult medical examination without abnormal findings: Secondary | ICD-10-CM

## 2014-08-28 DIAGNOSIS — Z79899 Other long term (current) drug therapy: Secondary | ICD-10-CM

## 2014-08-28 LAB — COMPLETE METABOLIC PANEL WITH GFR
ALBUMIN: 4 g/dL (ref 3.5–5.2)
ALK PHOS: 55 U/L (ref 39–117)
ALT: 30 U/L (ref 0–35)
AST: 22 U/L (ref 0–37)
BUN: 10 mg/dL (ref 6–23)
CHLORIDE: 105 meq/L (ref 96–112)
CO2: 27 meq/L (ref 19–32)
Calcium: 9.5 mg/dL (ref 8.4–10.5)
Creat: 0.68 mg/dL (ref 0.50–1.10)
GFR, Est African American: >89 mL/min
GFR, Est Non African American: >89 mL/min
GLUCOSE: 100 mg/dL — AB (ref 70–99)
Potassium: 4.2 mEq/L (ref 3.5–5.3)
Sodium: 143 mEq/L (ref 135–145)
Total Bilirubin: 0.7 mg/dL (ref 0.2–1.2)
Total Protein: 7.3 g/dL (ref 6.0–8.3)

## 2014-08-28 LAB — LIPID PANEL
Cholesterol: 189 mg/dL (ref 0–200)
HDL: 58 mg/dL (ref >=46)
LDL CALC: 109 mg/dL — AB (ref 0–99)
Total CHOL/HDL Ratio: 3.3 Ratio
Triglycerides: 109 mg/dL (ref <150)
VLDL: 22 mg/dL (ref 0–40)

## 2014-08-28 LAB — CBC WITH DIFFERENTIAL/PLATELET
BASOS ABS: 0.1 10*3/uL (ref 0.0–0.1)
BASOS PCT: 1 % (ref 0–1)
EOS PCT: 5 % (ref 0–5)
Eosinophils Absolute: 0.3 10*3/uL (ref 0.0–0.7)
HCT: 40.2 % (ref 36.0–46.0)
Hemoglobin: 13.4 g/dL (ref 12.0–15.0)
LYMPHS PCT: 42 % (ref 12–46)
Lymphs Abs: 2.7 10*3/uL (ref 0.7–4.0)
MCH: 30.3 pg (ref 26.0–34.0)
MCHC: 33.3 g/dL (ref 30.0–36.0)
MCV: 91 fL (ref 78.0–100.0)
MONO ABS: 0.4 10*3/uL (ref 0.1–1.0)
MPV: 10.5 fL (ref 8.6–12.4)
Monocytes Relative: 6 % (ref 3–12)
NEUTROS ABS: 3 10*3/uL (ref 1.7–7.7)
Neutrophils Relative %: 46 % (ref 43–77)
PLATELETS: 221 10*3/uL (ref 150–400)
RBC: 4.42 MIL/uL (ref 3.87–5.11)
RDW: 14.4 % (ref 11.5–15.5)
WBC: 6.5 10*3/uL (ref 4.0–10.5)

## 2014-08-28 LAB — TSH: TSH: 2.735 u[IU]/mL (ref 0.350–4.500)

## 2014-08-29 LAB — VITAMIN D 25 HYDROXY (VIT D DEFICIENCY, FRACTURES): Vit D, 25-Hydroxy: 26 ng/mL — ABNORMAL LOW (ref 30–100)

## 2014-09-05 ENCOUNTER — Encounter: Payer: Self-pay | Admitting: Family Medicine

## 2014-09-05 ENCOUNTER — Ambulatory Visit (INDEPENDENT_AMBULATORY_CARE_PROVIDER_SITE_OTHER): Payer: BLUE CROSS/BLUE SHIELD | Admitting: Family Medicine

## 2014-09-05 VITALS — BP 130/80 | HR 64 | Temp 98.2°F | Resp 14 | Ht 64.0 in | Wt 181.0 lb

## 2014-09-05 DIAGNOSIS — I8393 Asymptomatic varicose veins of bilateral lower extremities: Secondary | ICD-10-CM

## 2014-09-05 DIAGNOSIS — Z0000 Encounter for general adult medical examination without abnormal findings: Secondary | ICD-10-CM

## 2014-09-05 DIAGNOSIS — Z23 Encounter for immunization: Secondary | ICD-10-CM | POA: Diagnosis not present

## 2014-09-05 DIAGNOSIS — I8390 Asymptomatic varicose veins of unspecified lower extremity: Secondary | ICD-10-CM

## 2014-09-05 NOTE — Progress Notes (Signed)
Subjective:    Patient ID: Shannon Fitzgerald, female    DOB: [birth year], 54 y.o.   MRN: [id number]  HPI Patient is here today for complete physical exam. Last colonoscopy was in 2014. She is due for a mammogram as well as a Pap smear. She is requesting a referral to a GYN office for these test to be performed there. She is also requesting a referral to the vein in vascular clinic for varicose veins in her leg. She has several medium-sized varicosities worse in the left leg particularly in the left calf and left shin. There is no ulcer. There is no evidence of thrombophlebitis. Patient is wearing knee-high compression stockings on a daily basis. The symptoms seem to be getting worse. I explained to the patient that there is no current surgical issues but she would still like to speak with a vein in vascular clinic for second opinion Past Medical History  Diagnosis Date  . Allergy   . Medical history non-contributory    Past Surgical History  Procedure Laterality Date  . Tubal ligation    . Atherectomy  1988    had DVT lt leg during pregnancy-clot removed in [place]  . Orif radial fracture Left 07/27/2012    Procedure: OPEN REDUCTION INTERNAL FIXATION (ORIF) RADIAL FRACTURE;  Surgeon: Cammie Sickle., MD;  Location: [facility];  Service: Orthopedics;  Laterality: Left;   No current outpatient prescriptions on file prior to visit.   No current facility-administered medications on file prior to visit.   No Known Allergies History   Social History  . Marital Status: Married    Spouse Name: N/A  . Number of Children: N/A  . Years of Education: N/A   Occupational History  . Not on file.   Social History Main Topics  . Smoking status: Never Smoker   . Smokeless tobacco: Not on file  . Alcohol Use: Yes     Comment: occ  . Drug Use: No  . Sexual Activity: Yes     Comment: married, 3 sons.   Other Topics Concern  . Not on file   Social History Narrative    Family History  Problem Relation Age of Onset  . Diabetes Mother   . Glaucoma Mother   . Hypertension Mother   . Diabetes Father   . Heart disease Brother 29  . Diabetes Brother      Review of Systems  All other systems reviewed and are negative.      Objective:   Physical Exam  Constitutional: She is oriented to person, place, and time. She appears well-developed and well-nourished. No distress.  HENT:  Head: Normocephalic and atraumatic.  Right Ear: External ear normal.  Left Ear: External ear normal.  Nose: Nose normal.  Mouth/Throat: Oropharynx is clear and moist. No oropharyngeal exudate.  Eyes: Conjunctivae and EOM are normal. Pupils are equal, round, and reactive to light. Right eye exhibits no discharge. Left eye exhibits no discharge. No scleral icterus.  Neck: Normal range of motion. Neck supple. No JVD present. No tracheal deviation present. No thyromegaly present.  Cardiovascular: Normal rate, regular rhythm and intact distal pulses.  Exam reveals no gallop and no friction rub.   Murmur heard. Pulmonary/Chest: Effort normal and breath sounds normal. No stridor. No respiratory distress. She has no wheezes. She has no rales. She exhibits no tenderness.  Abdominal: Soft. Bowel sounds are normal. She exhibits no distension and no mass. There is no tenderness. There is no rebound  and no guarding.  Musculoskeletal: Normal range of motion. She exhibits no edema or tenderness.  Lymphadenopathy:    She has no cervical adenopathy.  Neurological: She is alert and oriented to person, place, and time. She has normal reflexes. She displays normal reflexes. No cranial nerve deficit. She exhibits normal muscle tone. Coordination normal.  Skin: Skin is warm. No rash noted. She is not diaphoretic. No erythema. No pallor.  Psychiatric: She has a normal mood and affect. Her behavior is normal. Judgment and thought content normal.  Vitals reviewed.         Assessment & Plan:   Routine general medical examination at a health care facility - Plan: Ambulatory referral to Gynecology, Tdap vaccine greater than or equal to 7yo IM  Varicose vein of leg - Plan: Ambulatory referral to Vascular Surgery  Need for Tdap vaccination - Plan: Tdap vaccine greater than or equal to 7yo IM  Patient's physical exam today is normal except for a small 1/6 systolic ejection murmur in several medium-sized varicose veins in both legs. I will gladly refer the patient to a gynecologist for pelvic exam as well as mammogram. I recommended thousand milligrams a day of calcium and 1000 units a day of vitamin D. I reviewed her recent lab work including a CBC, CMP, vitamin D level, TSH, fasting lipid panel. The only significant finding was a vitamin D that was low. Patient received a tetanus vaccine today. Patient would like to see the vein in vascular clinic to discuss treatments for varicose veins. I explained to the patient that there is no surgical issue at the present time and that her best option would be to elevate her legs as much as possible and wear the compression stockings. Still she relates to the vein in vascular clinic for second opinion and therefore I will facilitate this referral

## 2014-09-16 ENCOUNTER — Other Ambulatory Visit: Payer: Self-pay | Admitting: *Deleted

## 2014-09-16 DIAGNOSIS — I83893 Varicose veins of bilateral lower extremities with other complications: Secondary | ICD-10-CM

## 2014-10-08 ENCOUNTER — Encounter: Payer: Self-pay | Admitting: *Deleted

## 2014-11-29 ENCOUNTER — Encounter: Payer: Self-pay | Admitting: Vascular Surgery

## 2014-12-03 ENCOUNTER — Encounter (HOSPITAL_COMMUNITY): Payer: 59

## 2014-12-03 ENCOUNTER — Ambulatory Visit (HOSPITAL_COMMUNITY)
Admission: RE | Admit: 2014-12-03 | Discharge: 2014-12-03 | Disposition: A | Discharge status: Home / Self Care | Payer: BLUE CROSS/BLUE SHIELD | Source: Ambulatory Visit | Attending: Vascular Surgery | Admitting: Vascular Surgery

## 2014-12-03 ENCOUNTER — Encounter: Payer: 59 | Admitting: Vascular Surgery

## 2014-12-03 ENCOUNTER — Ambulatory Visit (INDEPENDENT_AMBULATORY_CARE_PROVIDER_SITE_OTHER): Payer: BLUE CROSS/BLUE SHIELD | Admitting: Vascular Surgery

## 2014-12-03 ENCOUNTER — Encounter: Payer: Self-pay | Admitting: Vascular Surgery

## 2014-12-03 VITALS — BP 136/82 | HR 55 | Ht 64.0 in | Wt 184.1 lb

## 2014-12-03 DIAGNOSIS — I83893 Varicose veins of bilateral lower extremities with other complications: Secondary | ICD-10-CM | POA: Diagnosis present

## 2014-12-03 NOTE — Progress Notes (Signed)
Subjective:     Patient ID: Shannon Fitzgerald, female   DOB: [birth year], 54 y.o.   MRN: [id number]  HPI This 54 year old female is evaluated for painful varicosities in both legs. She has developed progressive swelling from the knee to the foot bilaterally left worse than right over the last few years. 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 She had a history of DVT in the left leg 28 years ago which was treated with thrombectomy and she has had no swelling in the left leg up until the last few months. She has no history of stasis ulcers, bleeding, or superficial thrombophlebitis. She has tried short last docking's which have not relieved her smptoms. Symptoms are beginning to affect her daily living and ability to work.  Past Medical History  Diagnosis Date  . Allergy   . Medical history non-contributory     Social History  Substance Use Topics  . Smoking status: Former Smoker    Quit date: 02/09/1980  . Smokeless tobacco: Not on file  . Alcohol Use: 0.0 oz/week    0 Standard drinks or equivalent per week     Comment: occ    Family History  Problem Relation Age of Onset  . Diabetes Mother   . Glaucoma Mother   . Hypertension Mother   . Diabetes Father   . Heart disease Brother 52  . Diabetes Brother     No Known Allergies   Current outpatient prescriptions:  .  Cholecalciferol (VITAMIN D) 2000 UNITS tablet, Take 2,000 Units by mouth daily., Disp: , Rfl:   Filed Vitals:   12/03/14 1511  BP: 136/82  Pulse: 55  Height: 5\' 4"  (1.626 m)  Weight: 184 lb 1.6 oz (83.507 kg)  SpO2: 99%    Body mass index is 31.59 kg/(m^2).           Review of Systems  Denies chest pain, dyspnea on exertion, PND, orthopnea, hemoptysis. All systems negative and complete review of systems     Objective:   Physical Exam BP 136/82 mmHg  Pulse 55  Ht 5\' 4"  (1.626 m)  Wt 184 lb 1.6 oz (83.507 kg)  BMI 31.59 kg/m2  SpO2 99%  Gen.-alert and oriented x3 in no  apparent distress HEENT normal for age Lungs no rhonchi or wheezing Cardiovascular regular rhythm no murmurs carotid pulses 3+ palpable no bruits audible Abdomen soft nontender no palpable masses Musculoskeletal free of  major deformities Skin clear -no rashes Neurologic normal Lower extremities 3+ femoral and dorsalis pedis pulses palpable bilaterally with  1+ edema bilaterally Bulging varicosities left leg beginning in the distal thigh extending to the medial malleolus with some  Posterior varicosities as well Right leg with bulging varicosities medial calf to the ankle level with moderate amount of bulging varicosities posterior calf. No hyperpigmentation bilaterally and no active ulceration but edema-1+ bilaterally.  Today I ordered bilateral venous duplex exam which I reviewed and interpreted. There is no DVT in either leg. Right leg has gross reflux in the great saphenous vein and the small saphenous vein large caliber in the left leg has reflux in the great saphenous vein       Assessment:      #1 painful varicosities bilateral with chronic edema due to gross reflux right great and small saphenous veins and left great saphenous vein -symptoms are beginning to affect patient's daily living and ability to work     Plan:         #  1 long leg elastic compression stockings 20-30 mm gradient #2 elevate legs as much as possible #3 ibuprofen daily on a regular basis for pain #4 return in 3 months-if no significant improvement then  She will need #1 laser ablation left great saphenous vein plus greater than 20 stab phlebectomy followed by #2 laser ablation right small saphenous vein followed by #3 laser ablation right great saphenous vein plus greater than 20 stab phlebectomy  Return in 3 months for further evaluation

## 2015-02-25 ENCOUNTER — Encounter: Payer: Self-pay | Admitting: Vascular Surgery

## 2015-03-04 ENCOUNTER — Ambulatory Visit (INDEPENDENT_AMBULATORY_CARE_PROVIDER_SITE_OTHER): Payer: BLUE CROSS/BLUE SHIELD | Admitting: Vascular Surgery

## 2015-03-04 ENCOUNTER — Encounter: Payer: Self-pay | Admitting: Vascular Surgery

## 2015-03-04 VITALS — BP 130/75 | HR 65 | Temp 97.8°F | Resp 16 | Ht 64.0 in | Wt 192.0 lb

## 2015-03-04 DIAGNOSIS — I83893 Varicose veins of bilateral lower extremities with other complications: Secondary | ICD-10-CM

## 2015-03-04 NOTE — Progress Notes (Signed)
Subjective:     Patient ID: Shannon Fitzgerald, female   DOB: [birth year], 55 y.o.   MRN: [id number]  HPI this 55 year old female returns for continued follow-up regarding her painful varicosities in both legs. 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 Left is slightly worse than the right. She has no history of DVT thrombophlebitis stasis ulcers or bleeding. She has been wearing long leg elastic compression stockings 20-30 millimeter gradient and trying elevation and ibuprofen with no improvement in her symptoms. She is unable to elevate her legs while at work. This is affecting her daily living and ability to work.  Past Medical History  Diagnosis Date  . Allergy   . Medical history non-contributory     Social History  Substance Use Topics  . Smoking status: Former Smoker    Quit date: 02/09/1980  . Smokeless tobacco: Not on file  . Alcohol Use: 0.0 oz/week    0 Standard drinks or equivalent per week     Comment: occ    Family History  Problem Relation Age of Onset  . Diabetes Mother   . Glaucoma Mother   . Hypertension Mother   . Diabetes Father   . Heart disease Brother 10  . Diabetes Brother     No Known Allergies   Current outpatient prescriptions:  .  Cholecalciferol (VITAMIN D) 2000 UNITS tablet, Take 2,000 Units by mouth daily., Disp: , Rfl:   Filed Vitals:   03/04/15 0906  BP: 130/75  Pulse: 65  Temp: 97.8 F (36.6 C)  Resp: 16  Height: 5\' 4"  (1.626 m)  Weight: 192 lb (87.091 kg)  SpO2: 99%    Body mass index is 32.94 kg/(m^2).         Review of Systems denies chest pain, dyspnea on exertion, PND, orthopnea, hemoptysis     Objective:   Physical Exam BP 130/75 mmHg  Pulse 65  Temp(Src) 97.8 F (36.6 C)  Resp 16  Ht 5\' 4"  (1.626 m)  Wt 192 lb (87.091 kg)  BMI 32.94 kg/m2  SpO2 99%  Gen. well-developed well-nourished female in no apparent  distress alert and oriented 3 Lungs no rhonchi or wheezing Left leg with extensive bulging varicosities in the medial thigh and medial calf and posterior calf extending down to the ankle with 1+ edema. No active ulcer noted. 3+ dorsalis pedis pulse palpable. Right leg with bulging varicosities in the medial calf posterior calf and down to the medial malleolar area. 1+ edema present. 3+ dorsalis pedis pulse palpable.  Formal venous duplex exam at last visit revealed gross reflux and large caliber veins-right great saphenous and right small saphenous as well as gross reflux in left great saphenous vein supplying these painful varicosities     Assessment:     Painful varicosities with chronic progressive edema bilaterally due to gross reflux right great saphenous and small saphenous veins and left great saphenous vein. Symptoms are resistant to conservative measures. 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    Plan:     Patient needs a #1 laser ablation left great saphenous vein plus greater than 20 stab phlebectomy #2 laser ablation right small saphenous vein followed by #3 laser ablation right great saphenous vein plus greater than 20 stab phlebectomy  We will proceed with precertification to perform this in  the near future and hopefully eliminate her symptoms and improve her ability to continue working

## 2015-03-18 ENCOUNTER — Other Ambulatory Visit: Payer: Self-pay | Admitting: *Deleted

## 2015-03-18 DIAGNOSIS — I83893 Varicose veins of bilateral lower extremities with other complications: Secondary | ICD-10-CM

## 2015-04-28 ENCOUNTER — Other Ambulatory Visit: Payer: BLUE CROSS/BLUE SHIELD | Admitting: Vascular Surgery

## 2015-05-02 ENCOUNTER — Encounter: Payer: Self-pay | Admitting: Vascular Surgery

## 2015-05-05 ENCOUNTER — Encounter (HOSPITAL_COMMUNITY): Payer: BLUE CROSS/BLUE SHIELD

## 2015-05-05 ENCOUNTER — Ambulatory Visit: Payer: BLUE CROSS/BLUE SHIELD | Admitting: Vascular Surgery

## 2015-05-09 ENCOUNTER — Encounter: Payer: Self-pay | Admitting: Vascular Surgery

## 2015-05-12 ENCOUNTER — Encounter: Payer: Self-pay | Admitting: Vascular Surgery

## 2015-05-12 ENCOUNTER — Ambulatory Visit (INDEPENDENT_AMBULATORY_CARE_PROVIDER_SITE_OTHER): Payer: BLUE CROSS/BLUE SHIELD | Admitting: Vascular Surgery

## 2015-05-12 VITALS — BP 125/82 | HR 71 | Temp 97.6°F | Resp 16 | Ht 64.0 in | Wt 192.0 lb

## 2015-05-12 DIAGNOSIS — I83892 Varicose veins of left lower extremities with other complications: Secondary | ICD-10-CM | POA: Diagnosis not present

## 2015-05-12 NOTE — Progress Notes (Signed)
Subjective:     Patient ID: Shannon Fitzgerald, female   DOB: [birth year], 55 y.o.   MRN: [id number]  HPI this 55 year old female had laser ablation left great saphenous vein from the mid thigh to near the saphenofemoral junction performed under local tumescent anesthesia plus greater than 20 stab phlebectomy of painful varicosities. She tolerated the procedure well. A total of 1001 J of energy was utilized.   Review of Systems     Objective:   Physical Exam BP 125/82 mmHg  Pulse 71  Temp(Src) 97.6 F (36.4 C)  Resp 16  Ht 5\' 4"  (1.626 m)  Wt 192 lb (87.091 kg)  BMI 32.94 kg/m2  SpO2 98%      Assessment:     Well-tolerated laser ablation left great saphenous vein plus greater than 20 stab phlebectomy of painful varicosities performed under local tumescent anesthesia    Plan:     Return in 1 week for venous duplex exam to confirm closure left great saphenous vein

## 2015-05-12 NOTE — Progress Notes (Signed)
Laser Ablation Procedure    Date: 05/12/2015   Shannon Fitzgerald DOB:Apr 24, 1960  Consent signed: Yes    Surgeon:  Dr. Nelda Severe. Kellie Simmering  Procedure: Laser Ablation: left Greater Saphenous Vein  BP 125/82 mmHg  Pulse 71  Temp(Src) 97.6 F (36.4 C)  Resp 16  Ht 5\' 4"  (1.626 m)  Wt 192 lb (87.091 kg)  BMI 32.94 kg/m2  SpO2 98%  Tumescent Anesthesia: 430 cc 0.9% NaCl with 50 cc Lidocaine HCL with 1% Epi and 15 cc 8.4% NaHCO3  Local Anesthesia: 8 cc Lidocaine HCL and NaHCO3 (ratio 2:1)  Pulsed Mode: 15 watts, 545ms delay, 1.0 duration  Total Energy1001:              Total Pulses: 67               Total Time: 1:06    Stab Phlebectomy: >20 Sites: Thigh and Calf  Patient tolerated procedure well  Notes:   Description of Procedure:  After marking the course of the secondary varicosities, the patient was placed on the operating table in the supine position, and the left leg was prepped and draped in sterile fashion.   Local anesthetic was administered and under ultrasound guidance the saphenous vein was accessed with a micro needle and guide wire; then the mirco puncture sheath was placed.  A guide wire was inserted saphenofemoral junction , followed by a 5 french sheath.  The position of the sheath and then the laser fiber below the junction was confirmed using the ultrasound.  Tumescent anesthesia was administered along the course of the saphenous vein using ultrasound guidance. The patient was placed in Trendelenburg position and protective laser glasses were placed on patient and staff, and the laser was fired at 15 watts continuous mode advancing 1-54mm/second for a total of 1001 joules.   For stab phlebectomies, local anesthetic was administered at the previously marked varicosities, and tumescent anesthesia was administered around the vessels.  Greater than 20 stab wounds were made using the tip of an 11 blade. And using the vein hook, the phlebectomies were performed using a hemostat to  avulse the varicosities.  Adequate hemostasis was achieved.     Steri strips were applied to the stab wounds and ABD pads and thigh high compression stockings were applied.  Ace wrap bandages were applied over the phlebectomy sites and at the top of the saphenofemoral junction. Blood loss was less than 15 cc.  The patient ambulated out of the operating room having tolerated the procedure well.

## 2015-05-13 ENCOUNTER — Telehealth: Payer: Self-pay | Admitting: *Deleted

## 2015-05-13 NOTE — Telephone Encounter (Signed)
Pt doing well. No pain or bleeding. Following all instructions. 

## 2015-05-16 ENCOUNTER — Encounter: Payer: Self-pay | Admitting: Vascular Surgery

## 2015-05-19 ENCOUNTER — Encounter: Payer: Self-pay | Admitting: Vascular Surgery

## 2015-05-19 ENCOUNTER — Ambulatory Visit (INDEPENDENT_AMBULATORY_CARE_PROVIDER_SITE_OTHER): Payer: Self-pay | Admitting: Vascular Surgery

## 2015-05-19 ENCOUNTER — Ambulatory Visit (HOSPITAL_COMMUNITY)
Admission: RE | Admit: 2015-05-19 | Discharge: 2015-05-19 | Disposition: A | Discharge status: Home / Self Care | Payer: BLUE CROSS/BLUE SHIELD | Source: Ambulatory Visit | Attending: Vascular Surgery | Admitting: Vascular Surgery

## 2015-05-19 VITALS — BP 102/58 | HR 63 | Temp 98.0°F | Resp 18 | Ht 64.0 in | Wt 192.0 lb

## 2015-05-19 DIAGNOSIS — I83892 Varicose veins of left lower extremities with other complications: Secondary | ICD-10-CM

## 2015-05-19 DIAGNOSIS — Z9889 Other specified postprocedural states: Secondary | ICD-10-CM | POA: Insufficient documentation

## 2015-05-19 DIAGNOSIS — I83893 Varicose veins of bilateral lower extremities with other complications: Secondary | ICD-10-CM

## 2015-05-19 NOTE — Progress Notes (Signed)
Subjective:     Patient ID: Shannon Fitzgerald, female   DOB: [birth year], 55 y.o.   MRN: [id number]  HPI this 55 year old female returns 1 week post-laser ablation left great saphenous vein plus greater than 20 stab phlebectomy of painful varicosities. She did take her ibuprofen and where her elastic compression stocking as instructed. She has had some moderate discomfort in the mid to proximal thigh which is now resolving. She has had no pain in the stab phlebectomy sites. She states that her distal edema has improved significantly since the procedure.   Review of Systems     Objective:   Physical Exam BP 102/58 mmHg  Pulse 63  Temp(Src) 98 F (36.7 C) (Oral)  Resp 18  Ht 5\' 4"  (1.626 m)  Wt 192 lb (87.091 kg)  BMI 32.94 kg/m2  SpO2 97%  Gen. well-developed well-nourished female no apparent distress alert and oriented 3 Lungs no rhonchi or wheezing Left leg with mild discomfort to deep palpation over great saphenous vein in mid to proximal thigh. Stab phlebectomy sites healing nicely. Minimal distal edema. 3+ dorsalis pedis pulse palpable.  Today I ordered a venous duplex exam the left leg which I reviewed and interpreted. There is no DVT. The great saphenous vein is totally closed with distal thigh to near the saphenofemoral junction.     Assesent:     successful laser ablation   left great saphenous vein plus multiple stab phlebectomy of painful varicosities  Return next week for similar procedure and right small saphenous vein

## 2015-05-21 ENCOUNTER — Encounter: Payer: Self-pay | Admitting: Vascular Surgery

## 2015-05-26 ENCOUNTER — Encounter: Payer: Self-pay | Admitting: Vascular Surgery

## 2015-05-26 ENCOUNTER — Other Ambulatory Visit: Payer: BLUE CROSS/BLUE SHIELD | Admitting: Vascular Surgery

## 2015-05-26 ENCOUNTER — Ambulatory Visit (INDEPENDENT_AMBULATORY_CARE_PROVIDER_SITE_OTHER): Payer: BLUE CROSS/BLUE SHIELD | Admitting: Vascular Surgery

## 2015-05-26 VITALS — BP 131/61 | HR 68 | Temp 97.8°F | Resp 16 | Ht 64.0 in | Wt 192.0 lb

## 2015-05-26 DIAGNOSIS — I83891 Varicose veins of right lower extremities with other complications: Secondary | ICD-10-CM | POA: Diagnosis not present

## 2015-05-26 NOTE — Progress Notes (Signed)
Subjective:     Patient ID: Shannon Fitzgerald, female   DOB: 1960/10/23, 55 y.o.   MRN: [id number]  HPI this 55 year old female had laser ablation of the right small saphenous vein performed under local tumescent anesthesia. A total of 628 J of energy was utilized. She tolerated the procedure well. Review of Systems     Objective:   Physical Exam BP 131/61 mmHg  Pulse 68  Temp(Src) 97.8 F (36.6 C)  Resp 16  Ht 5\' 4"  (1.626 m)  Wt 192 lb (87.091 kg)  BMI 32.94 kg/m2  SpO2 99%       Assessment:     Well-tolerated laser ablation right small saphenous vein performed under local tumescent anesthesia    Plan:     Patient will return in 1 week for venous duplex exam to confirm closure right small saphenous vein She will then have laser ablation right great saphenous vein plus multiple stab phlebectomy of painful varicosities

## 2015-05-26 NOTE — Progress Notes (Signed)
Laser Ablation Procedure    Date: 05/26/2015   Shannon Fitzgerald DOB:[birth year]  Consent signed: Yes    Surgeon:  Dr. Nelda Severe. Kellie Simmering  Procedure: Laser Ablation: right Small Saphenous Vein  BP 131/61 mmHg  Pulse 68  Temp(Src) 97.8 F (36.6 C)  Resp 16  Ht 5\' 4"  (1.626 m)  Wt 192 lb (87.091 kg)  BMI 32.94 kg/m2  SpO2 99%  Tumescent Anesthesia: 120 cc 0.9% NaCl with 50 cc Lidocaine HCL with 1% Epi and 15 cc 8.4% NaHCO3  Local Anesthesia: 1 cc Lidocaine HCL and NaHCO3 (ratio 2:1)  Pulsed Mode: 15 watts, 519ms delay, 1.0 duration  Total Energy:   628           Total Pulses:    42            Total Time: :42    Patient tolerated procedure well  Notes:   Description of Procedure:  After marking the course of the secondary varicosities, the patient was placed on the operating table in the prone position, and the right leg was prepped and draped in sterile fashion.   Local anesthetic was administered and under ultrasound guidance the saphenous vein was accessed with a micro needle and guide wire; then the mirco puncture sheath was placed.  A guide wire was inserted saphenopopliteal junction , followed by a 5 french sheath.  The position of the sheath and then the laser fiber below the junction was confirmed using the ultrasound.  Tumescent anesthesia was administered along the course of the saphenous vein using ultrasound guidance. The patient was placed in Trendelenburg position and protective laser glasses were placed on patient and staff, and the laser was fired at 15 watts continuous mode advancing 1-75mm/second for a total of 628 joules.     Steri strips were applied to the stab wounds and ABD pads and thigh high compression stockings were applied.  Ace wrap bandages were applied over the phlebectomy sites and at the top of the saphenopopliteal junction. Blood loss was less than 15 cc.  The patient ambulated out of the operating room having tolerated the procedure well.

## 2015-05-27 ENCOUNTER — Telehealth: Payer: Self-pay | Admitting: *Deleted

## 2015-05-27 NOTE — Telephone Encounter (Signed)
Pt doing well. No problems, concerns or pain. Following all instructions.

## 2015-06-02 ENCOUNTER — Ambulatory Visit (INDEPENDENT_AMBULATORY_CARE_PROVIDER_SITE_OTHER): Payer: Self-pay | Admitting: Vascular Surgery

## 2015-06-02 ENCOUNTER — Encounter: Payer: Self-pay | Admitting: Vascular Surgery

## 2015-06-02 ENCOUNTER — Ambulatory Visit (HOSPITAL_COMMUNITY)
Admission: RE | Admit: 2015-06-02 | Discharge: 2015-06-02 | Disposition: A | Discharge status: Home / Self Care | Payer: BLUE CROSS/BLUE SHIELD | Source: Ambulatory Visit | Attending: Vascular Surgery | Admitting: Vascular Surgery

## 2015-06-02 VITALS — BP 123/66 | HR 58 | Temp 97.6°F | Resp 14

## 2015-06-02 DIAGNOSIS — Z9889 Other specified postprocedural states: Secondary | ICD-10-CM | POA: Diagnosis present

## 2015-06-02 DIAGNOSIS — I83891 Varicose veins of right lower extremities with other complications: Secondary | ICD-10-CM

## 2015-06-02 DIAGNOSIS — I83893 Varicose veins of bilateral lower extremities with other complications: Secondary | ICD-10-CM | POA: Diagnosis not present

## 2015-06-02 NOTE — Progress Notes (Signed)
Subjective:     Patient ID: Shannon Fitzgerald, female   DOB: [birth year], 55 y.o.   MRN: [id number]  HPI this 55 year old female returns 1 week post-laser ablation right small saphenous vein for gross reflux with painful varicosities. She has worn her long leg elastic compression stocking and taken ibuprofen as instructed. She has had very mild discomfort in the right posterior calf and no change in her chronic mild edema. She does continue to have the painful varicosities associated with her great saphenous system.  Past Medical History  Diagnosis Date  . Allergy   . Medical history non-contributory   . Varicose veins     Social History  Substance Use Topics  . Smoking status: Former Smoker    Quit date: 02/09/1980  . Smokeless tobacco: Not on file  . Alcohol Use: 0.0 oz/week    0 Standard drinks or equivalent per week     Comment: occ    Family History  Problem Relation Age of Onset  . Diabetes Mother   . Glaucoma Mother   . Hypertension Mother   . Diabetes Father   . Heart disease Brother 28  . Diabetes Brother     No Known Allergies   Current outpatient prescriptions:  .  Cholecalciferol (VITAMIN D) 2000 UNITS tablet, Take 2,000 Units by mouth daily., Disp: , Rfl:   Filed Vitals:   06/02/15 1006  BP: 123/66  Pulse: 58  Temp: 97.6 F (36.4 C)  Resp: 14  SpO2: 97%    There is no weight on file to calculate BMI.           Review of Systems and as chest pain, dyspnea on exertion, PND, orthopnea, hemoptysis, claudication     Objective:   Physical Exam BP 123/66 mmHg  Pulse 58  Temp(Src) 97.6 F (36.4 C)  Resp 14  SpO2 97%  Gen. well-developed well-nourished female no apparent distress alert and oriented 3 Lungs no rhonchi or wheezing Cardiovascular regular rhythm no murmurs Right leg with filed discomfort to palpation over the small saphenous vein in proximal calf. No distal edema noted. 3+ dorsalis pedis pulse palpable.  Today I ordered a venous  duplex exam of the right leg which I reviewed and interpreted. There is no DVT. There is total occlusion of the right small saphenous vein. Successful laser     Assessment:     Successful laser ablation right small saphenous vein for gross reflux with painful varicosities    Plan:     Patient to return in a few weeks for laser ablation right great saphenous vein plus multiple stab phlebectomy to complete her treatment regimen

## 2015-06-24 ENCOUNTER — Encounter: Payer: Self-pay | Admitting: Vascular Surgery

## 2015-06-30 ENCOUNTER — Ambulatory Visit (INDEPENDENT_AMBULATORY_CARE_PROVIDER_SITE_OTHER): Payer: BLUE CROSS/BLUE SHIELD | Admitting: Vascular Surgery

## 2015-06-30 ENCOUNTER — Other Ambulatory Visit: Payer: BLUE CROSS/BLUE SHIELD | Admitting: Vascular Surgery

## 2015-06-30 ENCOUNTER — Encounter: Payer: Self-pay | Admitting: Vascular Surgery

## 2015-06-30 VITALS — BP 132/63 | HR 72 | Temp 97.6°F | Resp 16 | Ht 64.0 in | Wt 192.0 lb

## 2015-06-30 DIAGNOSIS — I83891 Varicose veins of right lower extremities with other complications: Secondary | ICD-10-CM | POA: Diagnosis not present

## 2015-06-30 NOTE — Progress Notes (Signed)
Subjective:     Patient ID: Shannon Fitzgerald, female   DOB: 1960/02/26, 55 y.o.   MRN: [id number]  HPI this 55 year old female had laser ablation of the right great saphenous vein from the proximal calf to near the saphenofemoral junction plus greater than 20 stab phlebectomy of painful varicosities performed under local tumescent anesthesia. A total of 2320 J of energy was utilized. She tolerated the procedure well.   Review of Systems     Objective:   Physical Exam BP 132/63 mmHg  Pulse 72  Temp(Src) 97.6 F (36.4 C)  Resp 16  Ht 5\' 4"  (1.626 m)  Wt 192 lb (87.091 kg)  BMI 32.94 kg/m2  SpO2 100%       Assessment:     Well-tolerated laser ablation right great saphenous vein plus greater than 20 stab phlebectomy of painful varicosities performed under local tumescent anesthesia    Plan:     Return in 1 week for venous duplex exam to confirm closure right great saphenous vein

## 2015-06-30 NOTE — Progress Notes (Signed)
Laser Ablation Procedure    Date: 06/30/2015   Shannon Fitzgerald DOB:1961-01-31  Consent signed: Yes    Surgeon:  Dr. Nelda Severe. Kellie Simmering  Procedure: Laser Ablation: right Greater Saphenous Vein  BP 132/63 mmHg  Pulse 72  Temp(Src) 97.6 F (36.4 C)  Resp 16  Ht 5\' 4"  (1.626 m)  Wt 192 lb (87.091 kg)  BMI 32.94 kg/m2  SpO2 100%  Tumescent Anesthesia: 480 cc 0.9% NaCl with 50 cc Lidocaine HCL with 1% Epi and 15 cc 8.4% NaHCO3  Local Anesthesia: 7 cc Lidocaine HCL and NaHCO3 (ratio 2:1)  Pulsed Mode: 15 watts, 547ms delay, 1.0 duration  Total Energy: 2320             Total Pulses:    155            Total Time: 2:34    Patient tolerated procedure well  Notes:   Description of Procedure:  After marking the course of the secondary varicosities, the patient was placed on the operating table in the supine position, and the right leg was prepped and draped in sterile fashion.   Local anesthetic was administered and under ultrasound guidance the saphenous vein was accessed with a micro needle and guide wire; then the mirco puncture sheath was placed.  A guide wire was inserted saphenofemoral junction , followed by a 5 french sheath.  The position of the sheath and then the laser fiber below the junction was confirmed using the ultrasound.  Tumescent anesthesia was administered along the course of the saphenous vein using ultrasound guidance. The patient was placed in Trendelenburg position and protective laser glasses were placed on patient and staff, and the laser was fired at 15 watts continuous mode advancing 1-62mm/second for a total of 2320 joules.   For stab phlebectomies, local anesthetic was administered at the previously marked varicosities, and tumescent anesthesia was administered around the vessels.  Greater than 20 stab wounds were made using the tip of an 11 blade. And using the vein hook, the phlebectomies were performed using a hemostat to avulse the varicosities.  Adequate  hemostasis was achieved.     Steri strips were applied to the stab wounds and ABD pads and thigh high compression stockings were applied.  Ace wrap bandages were applied over the phlebectomy sites and at the top of the saphenofemoral junction. Blood loss was less than 15 cc.  The patient ambulated out of the operating room having tolerated the procedure well.

## 2015-07-01 ENCOUNTER — Encounter: Payer: Self-pay | Admitting: Vascular Surgery

## 2015-07-01 ENCOUNTER — Telehealth: Payer: Self-pay | Admitting: *Deleted

## 2015-07-01 NOTE — Telephone Encounter (Signed)
Pt had a rough night but no bleeding from the phlebectomy sites. Following all instructions. Told her it was fine to loosen the ace wraps. Will see her next week at her fu appts.

## 2015-07-02 ENCOUNTER — Encounter: Payer: Self-pay | Admitting: Vascular Surgery

## 2015-07-08 ENCOUNTER — Ambulatory Visit (HOSPITAL_COMMUNITY)
Admission: RE | Admit: 2015-07-08 | Discharge: 2015-07-08 | Disposition: A | Discharge status: Home / Self Care | Payer: BLUE CROSS/BLUE SHIELD | Source: Ambulatory Visit | Attending: Vascular Surgery | Admitting: Vascular Surgery

## 2015-07-08 ENCOUNTER — Encounter: Payer: Self-pay | Admitting: Vascular Surgery

## 2015-07-08 ENCOUNTER — Ambulatory Visit (INDEPENDENT_AMBULATORY_CARE_PROVIDER_SITE_OTHER): Payer: Self-pay | Admitting: Vascular Surgery

## 2015-07-08 VITALS — BP 140/80 | HR 64 | Temp 98.0°F | Resp 16 | Ht 64.0 in | Wt 192.0 lb

## 2015-07-08 DIAGNOSIS — I83893 Varicose veins of bilateral lower extremities with other complications: Secondary | ICD-10-CM | POA: Diagnosis not present

## 2015-07-08 DIAGNOSIS — I83891 Varicose veins of right lower extremities with other complications: Secondary | ICD-10-CM

## 2015-07-08 DIAGNOSIS — Z9889 Other specified postprocedural states: Secondary | ICD-10-CM | POA: Insufficient documentation

## 2015-07-08 NOTE — Progress Notes (Signed)
Subjective:     Patient ID: Shannon Fitzgerald, female   DOB: [birth year], 55 y.o.   MRN: [id number]  HPI this 55 year old female returns 1 week post-laser ablation right great saphenous vein plus greater than 20 stab phlebectomy of painful varicosities. This was preceded by laser ablation of the right small saphenous vein. She has experienced some moderate discomfort in the right medial thigh particularly distally around the knee level following the great saphenous procedure this is improving daily however. She's had no distal edema. She has worn her elastic compression stocking and taken ibuprofen as instructed.   Review of Systems denies chest pain, dyspnea on exertion, PND, orthopnea, hemoptysis    Objective:   Physical Exam BP 140/80 mmHg  Pulse 64  Temp(Src) 98 F (36.7 C)  Resp 16  Ht 5\' 4"  (1.626 m)  Wt 192 lb (87.091 kg)  BMI 32.94 kg/m2  SpO2 99%  Gen. well-developed well-nourished female no apparent distress alert and oriented 3 Lungs no rhonchi or wheezing Right leg with mild discomfort to palpation in the proximal thigh and moderate discomfort in the distal thigh with no erythema or blistering. Saphenous vein is palpable beneath the skin. No distal edema noted. Stab phlebectomy sites all healing nicely.  Today I ordered a venous duplex exam the right leg which I reviewed and interpreted. There is no DVT. There is total closure of the right small saphenous and great saphenous veins up to near the junctions with the deep system.     Assessment:     Successful laser ablation right great and small saphenous veins with multiple stab phlebectomy of painful varicosities    Plan:    return to see me on when necessary basis

## 2015-11-14 ENCOUNTER — Other Ambulatory Visit: Payer: BLUE CROSS/BLUE SHIELD

## 2015-11-14 DIAGNOSIS — Z0000 Encounter for general adult medical examination without abnormal findings: Secondary | ICD-10-CM

## 2015-11-14 DIAGNOSIS — E559 Vitamin D deficiency, unspecified: Secondary | ICD-10-CM

## 2015-11-14 LAB — COMPLETE METABOLIC PANEL WITH GFR
ALBUMIN: 4 g/dL (ref 3.6–5.1)
ALK PHOS: 70 U/L (ref 33–130)
ALT: 21 U/L (ref 6–29)
AST: 19 U/L (ref 10–35)
BILIRUBIN TOTAL: 0.5 mg/dL (ref 0.2–1.2)
BUN: 13 mg/dL (ref 7–25)
CALCIUM: 9.4 mg/dL (ref 8.6–10.4)
CO2: 27 mmol/L (ref 20–31)
Chloride: 104 mmol/L (ref 98–110)
Creat: 0.77 mg/dL (ref 0.50–1.05)
GFR, Est Non African American: 87 mL/min (ref >=60)
Glucose, Bld: 105 mg/dL — ABNORMAL HIGH (ref 70–99)
POTASSIUM: 4.7 mmol/L (ref 3.5–5.3)
Sodium: 140 mmol/L (ref 135–146)
TOTAL PROTEIN: 7.2 g/dL (ref 6.1–8.1)

## 2015-11-14 LAB — CBC WITH DIFFERENTIAL/PLATELET
BASOS ABS: 0 {cells}/uL (ref 0–200)
Basophils Relative: 0 %
EOS PCT: 4 %
Eosinophils Absolute: 252 cells/uL (ref 15–500)
HCT: 41.2 % (ref 35.0–45.0)
Hemoglobin: 13.6 g/dL (ref 12.0–15.0)
LYMPHS ABS: 2457 {cells}/uL (ref 850–3900)
Lymphocytes Relative: 39 %
MCH: 29.7 pg (ref 27.0–33.0)
MCHC: 33 g/dL (ref 32.0–36.0)
MCV: 90 fL (ref 80.0–100.0)
MONOS PCT: 7 %
MPV: 9.7 fL (ref 7.5–12.5)
Monocytes Absolute: 441 cells/uL (ref 200–950)
NEUTROS ABS: 3150 {cells}/uL (ref 1500–7800)
Neutrophils Relative %: 50 %
PLATELETS: 243 10*3/uL (ref 140–400)
RBC: 4.58 MIL/uL (ref 3.80–5.10)
RDW: 13.6 % (ref 11.0–15.0)
WBC: 6.3 10*3/uL (ref 3.8–10.8)

## 2015-11-14 LAB — TSH: TSH: 1.9 m[IU]/L

## 2015-11-14 LAB — LIPID PANEL
Cholesterol: 167 mg/dL (ref 125–200)
HDL: 51 mg/dL (ref >=46)
LDL CALC: 93 mg/dL (ref <130)
TRIGLYCERIDES: 114 mg/dL (ref <150)
Total CHOL/HDL Ratio: 3.3 Ratio (ref <=5.0)
VLDL: 23 mg/dL (ref <30)

## 2015-11-15 LAB — VITAMIN D 25 HYDROXY (VIT D DEFICIENCY, FRACTURES): VIT D 25 HYDROXY: 28 ng/mL — AB (ref 30–100)

## 2015-11-18 ENCOUNTER — Encounter: Payer: Self-pay | Admitting: Family Medicine

## 2015-11-18 ENCOUNTER — Ambulatory Visit (INDEPENDENT_AMBULATORY_CARE_PROVIDER_SITE_OTHER): Payer: BLUE CROSS/BLUE SHIELD | Admitting: Family Medicine

## 2015-11-18 VITALS — BP 122/68 | HR 68 | Temp 98.2°F | Resp 16 | Ht 64.0 in | Wt 198.0 lb

## 2015-11-18 DIAGNOSIS — M545 Low back pain, unspecified: Secondary | ICD-10-CM

## 2015-11-18 DIAGNOSIS — Z0000 Encounter for general adult medical examination without abnormal findings: Secondary | ICD-10-CM | POA: Diagnosis not present

## 2015-11-18 MED ORDER — CYCLOBENZAPRINE HCL 10 MG PO TABS: 10.0000 mg | ORAL_TABLET | Freq: Three times a day (TID) | ORAL | 0 refills | Status: DC | PRN

## 2015-11-18 MED ORDER — DICLOFENAC SODIUM 75 MG PO TBEC: 75.0000 mg | DELAYED_RELEASE_TABLET | Freq: Two times a day (BID) | ORAL | 0 refills | Status: DC

## 2015-11-18 NOTE — Progress Notes (Signed)
Subjective:    Patient ID: Shannon Fitzgerald, female    DOB: January 18, 1961, 55 y.o.   MRN: [id number]  HPI Patient is a very pleasant 55 year old white female who presents today for complete physical exam. She receives her gynecologic care at [facility]. Her appointment is scheduled for an later in October for her Pap smear and mammogram. Last year they were normal. Her colonoscopy is up-to-date. Her flu shot and tetanus shot are up-to-date. Her only concern is some midline to left-sided low back pain 2 weeks. It occurred gradually after lifting a heavy object at work. She denies any sciatica. She denies any numbness or weakness in her legs. She denies any saddle anesthesia or bowel or bladder incontinence. Otherwise she's been doing well with no concerns. Her recent lab work as listed below: Lab on 11/14/2015  Component Date Value Ref Range Status  . Sodium 11/14/2015 140  135 - 146 mmol/L Final  . Potassium 11/14/2015 4.7  3.5 - 5.3 mmol/L Final  . Chloride 11/14/2015 104  98 - 110 mmol/L Final  . CO2 11/14/2015 27  20 - 31 mmol/L Final  . Glucose, Bld 11/14/2015 105* 70 - 99 mg/dL Final  . BUN 11/14/2015 13  7 - 25 mg/dL Final  . Creat 11/14/2015 0.77  0.50 - 1.05 mg/dL Final   Comment:   For patients > or = 55 years of age: The upper reference limit for Creatinine is approximately 13% higher for people identified as African-American.     . Total Bilirubin 11/14/2015 0.5  0.2 - 1.2 mg/dL Final  . Alkaline Phosphatase 11/14/2015 70  33 - 130 U/L Final  . AST 11/14/2015 19  10 - 35 U/L Final  . ALT 11/14/2015 21  6 - 29 U/L Final  . Total Protein 11/14/2015 7.2  6.1 - 8.1 g/dL Final  . Albumin 11/14/2015 4.0  3.6 - 5.1 g/dL Final  . Calcium 11/14/2015 9.4  8.6 - 10.4 mg/dL Final  . GFR, Est African American 11/14/2015 >89  >=60 mL/min Final  . GFR, Est Non African American 11/14/2015 87  >=60 mL/min Final  . TSH 11/14/2015 1.90  mIU/L Final   Comment:   Reference Range   >  or = 20 Years  0.40-4.50   Pregnancy Range First trimester  0.26-2.66 Second trimester 0.55-2.73 Third trimester  0.43-2.91     . Cholesterol 11/14/2015 167  125 - 200 mg/dL Final  . Triglycerides 11/14/2015 114  <150 mg/dL Final  . HDL 11/14/2015 51  >=46 mg/dL Final  . Total CHOL/HDL Ratio 11/14/2015 3.3  <=5.0 Ratio Final  . VLDL 11/14/2015 23  <30 mg/dL Final  . LDL Cholesterol 11/14/2015 93  <130 mg/dL Final   Comment:   Total Cholesterol/HDL Ratio:CHD Risk                        Coronary Heart Disease Risk Table                                        Men       Women          1/2 Average Risk              3.4        3.3              Average Risk  5.0        4.4           2X Average Risk              9.6        7.1           3X Average Risk             23.4       11.0 Use the calculated Patient Ratio above and the CHD Risk table  to determine the patient's CHD Risk.   . WBC 11/14/2015 6.3  3.8 - 10.8 K/uL Final  . RBC 11/14/2015 4.58  3.80 - 5.10 MIL/uL Final  . Hemoglobin 11/14/2015 13.6  12.0 - 15.0 g/dL Final  . HCT 11/14/2015 41.2  35.0 - 45.0 % Final  . MCV 11/14/2015 90.0  80.0 - 100.0 fL Final  . MCH 11/14/2015 29.7  27.0 - 33.0 pg Final  . MCHC 11/14/2015 33.0  32.0 - 36.0 g/dL Final  . RDW 11/14/2015 13.6  11.0 - 15.0 % Final  . Platelets 11/14/2015 243  140 - 400 K/uL Final  . MPV 11/14/2015 9.7  7.5 - 12.5 fL Final  . Neutro Abs 11/14/2015 3150  1,500 - 7,800 cells/uL Final  . Lymphs Abs 11/14/2015 2457  850 - 3,900 cells/uL Final  . Monocytes Absolute 11/14/2015 441  200 - 950 cells/uL Final  . Eosinophils Absolute 11/14/2015 252  15 - 500 cells/uL Final  . Basophils Absolute 11/14/2015 0  0 - 200 cells/uL Final  . Neutrophils Relative % 11/14/2015 50  % Final  . Lymphocytes Relative 11/14/2015 39  % Final  . Monocytes Relative 11/14/2015 7  % Final  . Eosinophils Relative 11/14/2015 4  % Final  . Basophils Relative 11/14/2015 0  % Final  .  Smear Review 11/14/2015 Criteria for review not met   Final  . Vit D, 25-Hydroxy 11/15/2015 28* 30 - 100 ng/mL Final   Comment: Vitamin D Status           25-OH Vitamin D        Deficiency                <20 ng/mL        Insufficiency         20 - 29 ng/mL        Optimal             > or = 30 ng/mL   For 25-OH Vitamin D testing on patients on D2-supplementation and patients for whom quantitation of D2 and D3 fractions is required, the [facility] 25-OH VIT D, (D2,D3), LC/MS/MS is recommended: order code [id number] (patients > 2 yrs).    Past Medical History:  Diagnosis Date  . Allergy   . Medical history non-contributory   . Varicose veins    Past Surgical History:  Procedure Laterality Date  . ATHERECTOMY  1988   had DVT lt leg during pregnancy-clot removed in [place]  . LASER ABLATION Left   . ORIF RADIAL FRACTURE Left 07/27/2012   Procedure: OPEN REDUCTION INTERNAL FIXATION (ORIF) RADIAL FRACTURE;  Surgeon: Cammie Sickle., MD;  Location: [facility];  Service: Orthopedics;  Laterality: Left;  . TUBAL LIGATION     Current Outpatient Prescriptions on File Prior to Visit  Medication Sig Dispense Refill  . Cholecalciferol (VITAMIN D) 2000 UNITS tablet Take 2,000 Units by mouth daily.     No current facility-administered medications  on file prior to visit.    No Known Allergies Social History   Social History  . Marital status: Married    Spouse name: N/A  . Number of children: N/A  . Years of education: N/A   Occupational History  . Not on file.   Social History Main Topics  . Smoking status: Former Smoker    Quit date: 02/09/1980  . Smokeless tobacco: Not on file  . Alcohol use 0.0 oz/week     Comment: occ  . Drug use: No  . Sexual activity: Yes     Comment: married, 3 sons.   Other Topics Concern  . Not on file   Social History Narrative  . No narrative on file   Family History  Problem Relation Age of Onset  . Diabetes Mother   . Glaucoma  Mother   . Hypertension Mother   . Diabetes Father   . Heart disease Brother 61  . Diabetes Brother       Review of Systems  All other systems reviewed and are negative.      Objective:   Physical Exam  Constitutional: She is oriented to person, place, and time. She appears well-developed and well-nourished. No distress.  HENT:  Head: Normocephalic and atraumatic.  Right Ear: External ear normal.  Left Ear: External ear normal.  Nose: Nose normal.  Mouth/Throat: Oropharynx is clear and moist. No oropharyngeal exudate.  Eyes: Conjunctivae and EOM are normal. Pupils are equal, round, and reactive to light. Right eye exhibits no discharge. Left eye exhibits no discharge. No scleral icterus.  Neck: Normal range of motion. Neck supple. No JVD present. No tracheal deviation present. No thyromegaly present.  Cardiovascular: Normal rate, regular rhythm, normal heart sounds and intact distal pulses.  Exam reveals no gallop and no friction rub.   No murmur heard. Pulmonary/Chest: Effort normal and breath sounds normal. No stridor. No respiratory distress. She has no wheezes. She has no rales. She exhibits no tenderness.  Abdominal: Soft. Bowel sounds are normal. She exhibits no distension and no mass. There is no tenderness. There is no rebound and no guarding.  Musculoskeletal: Normal range of motion. She exhibits no edema, tenderness or deformity.  Lymphadenopathy:    She has no cervical adenopathy.  Neurological: She is alert and oriented to person, place, and time. She displays normal reflexes. No cranial nerve deficit. She exhibits normal muscle tone. Coordination normal.  Skin: Skin is warm. No rash noted. She is not diaphoretic. No erythema. No pallor.  Psychiatric: She has a normal mood and affect. Her behavior is normal. Judgment and thought content normal.  Vitals reviewed.         Assessment & Plan:  Midline low back pain without sciatica, unspecified chronicity - Plan:  diclofenac (VOLTAREN) 75 MG EC tablet, cyclobenzaprine (FLEXERIL) 10 MG tablet  Routine general medical examination at a health care facility  I recommended diet exercise and weight loss to address her slightly elevated blood sugar. Continue vitamin D 2000 units a day. Pap smear and mammogram will be performed at her gynecologist. Colonoscopy is up-to-date. The remainder of her lab work is excellent. Immunizations are up-to-date. We will try diclofenac 75 mg by mouth twice a day and Flexeril 10 mg by mouth daily at bedtime for her back pain. Reassess in 2-3 weeks if still present for possible imaging

## 2015-12-05 DIAGNOSIS — E669 Obesity, unspecified: Secondary | ICD-10-CM | POA: Insufficient documentation

## 2016-12-10 LAB — HM MAMMOGRAPHY

## 2017-05-16 ENCOUNTER — Other Ambulatory Visit: Payer: BLUE CROSS/BLUE SHIELD

## 2017-05-16 DIAGNOSIS — Z0000 Encounter for general adult medical examination without abnormal findings: Secondary | ICD-10-CM

## 2017-05-19 ENCOUNTER — Other Ambulatory Visit: Payer: Self-pay

## 2017-05-19 ENCOUNTER — Ambulatory Visit (INDEPENDENT_AMBULATORY_CARE_PROVIDER_SITE_OTHER): Payer: BLUE CROSS/BLUE SHIELD | Admitting: Family Medicine

## 2017-05-19 ENCOUNTER — Encounter: Payer: Self-pay | Admitting: *Deleted

## 2017-05-19 ENCOUNTER — Encounter: Payer: Self-pay | Admitting: Family Medicine

## 2017-05-19 VITALS — BP 128/72 | HR 78 | Temp 98.2°F | Resp 14 | Ht 64.0 in | Wt 206.0 lb

## 2017-05-19 DIAGNOSIS — Z114 Encounter for screening for human immunodeficiency virus [HIV]: Secondary | ICD-10-CM | POA: Diagnosis not present

## 2017-05-19 DIAGNOSIS — Z0000 Encounter for general adult medical examination without abnormal findings: Secondary | ICD-10-CM | POA: Diagnosis not present

## 2017-05-19 DIAGNOSIS — R739 Hyperglycemia, unspecified: Secondary | ICD-10-CM

## 2017-05-19 DIAGNOSIS — Z1159 Encounter for screening for other viral diseases: Secondary | ICD-10-CM | POA: Diagnosis not present

## 2017-05-19 DIAGNOSIS — E782 Mixed hyperlipidemia: Secondary | ICD-10-CM

## 2017-05-19 MED ORDER — OMEPRAZOLE 20 MG PO CPDR: 20.0000 mg | DELAYED_RELEASE_CAPSULE | Freq: Every day | ORAL | 0 refills | Status: DC

## 2017-05-19 NOTE — Progress Notes (Signed)
Subjective:    Patient ID: Shannon Fitzgerald, female    DOB: Jan 26, 1961, 57 y.o.   MRN: [id number]  HPI Patient is here today for complete physical exam. She believes her last colonoscopy was at age 3 and be due again in 2022.  Recorded differently in epic.  She gets her pap, pelvic, and mammogram at [facility].  Mammogram is due in September.  Pap was less than 3 years ago.  Most recent labs are listed below: Lab on 05/16/2017  Component Date Value Ref Range Status  . WBC 05/16/2017 7.1  3.8 - 10.8 Thousand/uL Final  . RBC 05/16/2017 4.70  3.80 - 5.10 Million/uL Final  . Hemoglobin 05/16/2017 13.8  11.7 - 15.5 g/dL Final  . HCT 05/16/2017 41.1  35.0 - 45.0 % Final  . MCV 05/16/2017 87.4  80.0 - 100.0 fL Final  . MCH 05/16/2017 29.4  27.0 - 33.0 pg Final  . MCHC 05/16/2017 33.6  32.0 - 36.0 g/dL Final  . RDW 05/16/2017 13.7  11.0 - 15.0 % Final  . Platelets 05/16/2017 228  140 - 400 Thousand/uL Final  . MPV 05/16/2017 10.6  7.5 - 12.5 fL Final  . Neutro Abs 05/16/2017 3,536  1,500 - 7,800 cells/uL Final  . Lymphs Abs 05/16/2017 2,627  850 - 3,900 cells/uL Final  . WBC mixed population 05/16/2017 568  200 - 950 cells/uL Final  . Eosinophils Absolute 05/16/2017 320  15 - 500 cells/uL Final  . Basophils Absolute 05/16/2017 50  0 - 200 cells/uL Final  . Neutrophils Relative % 05/16/2017 49.8  % Final  . Total Lymphocyte 05/16/2017 37.0  % Final  . Monocytes Relative 05/16/2017 8.0  % Final  . Eosinophils Relative 05/16/2017 4.5  % Final  . Basophils Relative 05/16/2017 0.7  % Final  . Glucose, Bld 05/16/2017 110* 65 - 99 mg/dL Final   Comment: .            Fasting reference interval . For someone without known diabetes, a glucose value between 100 and 125 mg/dL is consistent with prediabetes and should be confirmed with a follow-up test. .   . BUN 05/16/2017 12  7 - 25 mg/dL Final  . Creat 05/16/2017 0.83  0.50 - 1.05 mg/dL Final   Comment: For patients >53 years of age, the reference  limit for Creatinine is approximately 13% higher for people identified as African-American. .   Havery Moros Ratio 16/38/4536 NOT APPLICABLE  6 - 22 (calc) Final  . Sodium 05/16/2017 139  135 - 146 mmol/L Final  . Potassium 05/16/2017 4.7  3.5 - 5.3 mmol/L Final  . Chloride 05/16/2017 102  98 - 110 mmol/L Final  . CO2 05/16/2017 29  20 - 32 mmol/L Final  . Calcium 05/16/2017 10.1  8.6 - 10.4 mg/dL Final  . Total Protein 05/16/2017 7.6  6.1 - 8.1 g/dL Final  . Albumin 05/16/2017 4.6  3.6 - 5.1 g/dL Final  . Globulin 05/16/2017 3.0  1.9 - 3.7 g/dL (calc) Final  . AG Ratio 05/16/2017 1.5  1.0 - 2.5 (calc) Final  . Total Bilirubin 05/16/2017 0.6  0.2 - 1.2 mg/dL Final  . Alkaline phosphatase (APISO) 05/16/2017 96  33 - 130 U/L Final  . AST 05/16/2017 44* 10 - 35 U/L Final  . ALT 05/16/2017 66* 6 - 29 U/L Final  . Cholesterol 05/16/2017 219* <200 mg/dL Final  . HDL 05/16/2017 55  >50 mg/dL Final  . Triglycerides 05/16/2017 143  <150 mg/dL Final  .  LDL Cholesterol (Calc) 05/16/2017 137* mg/dL (calc) Final   Comment: Reference range: <100 . Desirable range <100 mg/dL for primary prevention;   <70 mg/dL for patients with CHD or diabetic patients  with > or = 2 CHD risk factors. Marland Kitchen LDL-C is now calculated using the Martin-Hopkins  calculation, which is a validated novel method providing  better accuracy than the Friedewald equation in the  estimation of LDL-C.  Cresenciano Genre et al. Annamaria Helling. 1638;466(59): 2061-2068  (http://education.[facility].com/faq/FAQ164)   . Total CHOL/HDL Ratio 05/16/2017 4.0  <5.0 (calc) Final  . Non-HDL Cholesterol (Calc) 05/16/2017 164* <130 mg/dL (calc) Final   Comment: For patients with diabetes plus 1 major ASCVD risk  factor, treating to a non-HDL-C goal of <100 mg/dL  (LDL-C of <70 mg/dL) is considered a therapeutic  option.   . TSH 05/16/2017 2.88  0.40 - 4.50 mIU/L Final   Immunization History  Administered Date(s) Administered  . Hepatitis B  12/02/2005, 01/03/2006, 03/28/2006  . IPV 03/10/1999  . Influenza-Unspecified 11/09/2015  . MMR 07/28/2004  . Td 03/10/1999  . Tdap 09/05/2014    Past Medical History:  Diagnosis Date  . Allergy   . Medical history non-contributory   . Varicose veins    Past Surgical History:  Procedure Laterality Date  . ATHERECTOMY  1988   had DVT lt leg during pregnancy-clot removed in [place]  . LASER ABLATION Left   . ORIF RADIAL FRACTURE Left 07/27/2012   Procedure: OPEN REDUCTION INTERNAL FIXATION (ORIF) RADIAL FRACTURE;  Surgeon: Cammie Sickle., MD;  Location: [facility];  Service: Orthopedics;  Laterality: Left;  . TUBAL LIGATION     Current Outpatient Medications on File Prior to Visit  Medication Sig Dispense Refill  . Cholecalciferol (VITAMIN D) 2000 UNITS tablet Take 2,000 Units by mouth daily.    . Multiple Vitamin (MULTIVITAMIN WITH MINERALS) TABS tablet Take 1 tablet by mouth daily.    . cyclobenzaprine (FLEXERIL) 10 MG tablet Take 1 tablet (10 mg total) by mouth 3 (three) times daily as needed for muscle spasms. (Patient not taking: Reported on 05/19/2017) 30 tablet 0  . diclofenac (VOLTAREN) 75 MG EC tablet Take 1 tablet (75 mg total) by mouth 2 (two) times daily. (Patient not taking: Reported on 05/19/2017) 30 tablet 0   No current facility-administered medications on file prior to visit.    No Known Allergies Social History   Socioeconomic History  . Marital status: Married    Spouse name: Not on file  . Number of children: Not on file  . Years of education: Not on file  . Highest education level: Not on file  Occupational History  . Not on file  Social Needs  . Financial resource strain: Not on file  . Food insecurity:    Worry: Not on file    Inability: Not on file  . Transportation needs:    Medical: Not on file    Non-medical: Not on file  Tobacco Use  . Smoking status: Former Smoker    Last attempt to quit: 02/09/1980    Years since  quitting: 37.2  . Smokeless tobacco: Never Used  Substance and Sexual Activity  . Alcohol use: Yes    Alcohol/week: 0.0 oz    Comment: occ  . Drug use: No  . Sexual activity: Yes    Comment: married, 3 sons.  Lifestyle  . Physical activity:    Days per week: Not on file    Minutes per session: Not on  file  . Stress: Not on file  Relationships  . Social connections:    Talks on phone: Not on file    Gets together: Not on file    Attends religious service: Not on file    Active member of club or organization: Not on file    Attends meetings of clubs or organizations: Not on file    Relationship status: Not on file  . Intimate partner violence:    Fear of current or ex partner: Not on file    Emotionally abused: Not on file    Physically abused: Not on file    Forced sexual activity: Not on file  Other Topics Concern  . Not on file  Social History Narrative  . Not on file   Family History  Problem Relation Age of Onset  . Diabetes Mother   . Glaucoma Mother   . Hypertension Mother   . Diabetes Father   . Heart disease Brother 65  . Diabetes Brother      Review of Systems  All other systems reviewed and are negative.      Objective:   Physical Exam  Constitutional: She is oriented to person, place, and time. She appears well-developed and well-nourished. No distress.  HENT:  Head: Normocephalic and atraumatic.  Right Ear: External ear normal.  Left Ear: External ear normal.  Nose: Nose normal.  Mouth/Throat: Oropharynx is clear and moist. No oropharyngeal exudate.  Eyes: Pupils are equal, round, and reactive to light. Conjunctivae and EOM are normal. Right eye exhibits no discharge. Left eye exhibits no discharge. No scleral icterus.  Neck: Normal range of motion. Neck supple. No JVD present. No tracheal deviation present. No thyromegaly present.  Cardiovascular: Normal rate, regular rhythm and intact distal pulses. Exam reveals no gallop and no friction rub.    Murmur heard. Pulmonary/Chest: Effort normal and breath sounds normal. No stridor. No respiratory distress. She has no wheezes. She has no rales. She exhibits no tenderness.  Abdominal: Soft. Bowel sounds are normal. She exhibits no distension and no mass. There is no tenderness. There is no rebound and no guarding.  Musculoskeletal: Normal range of motion. She exhibits no edema or tenderness.  Lymphadenopathy:    She has no cervical adenopathy.  Neurological: She is alert and oriented to person, place, and time. She has normal reflexes. No cranial nerve deficit. She exhibits normal muscle tone. Coordination normal.  Skin: Skin is warm. No rash noted. She is not diaphoretic. No erythema. No pallor.  Psychiatric: She has a normal mood and affect. Her behavior is normal. Judgment and thought content normal.  Vitals reviewed.         Assessment & Plan:  Encounter for hepatitis C screening test for low risk patient - Plan: Hepatitis C antibody  Encounter for screening for HIV - Plan: HIV antibody  Routine general medical examination at a health care facility  Mixed hyperlipidemia  Hyperglycemia  Mammogram is due this fall. Pap is utd.  Immunizations are UTD.  Colonoscopy is due in 2022.  Spent 20 minutes discuss therapeutic lifestyle changes.  Borderline prediabetic.  LDL is elevated.  LFT's are elevated and I suspect hepatic steatosis.  Try 30 min a day of aerobic exercise 5 days a week.  !0-15 lb weight loss.  Low carb diet an drecheck labs in 6 months.  Will also screen for hepc and HIV.

## 2017-05-20 LAB — COMPREHENSIVE METABOLIC PANEL
AG Ratio: 1.5 (calc) (ref 1.0–2.5)
ALBUMIN MSPROF: 4.6 g/dL (ref 3.6–5.1)
ALKALINE PHOSPHATASE (APISO): 96 U/L (ref 33–130)
ALT: 66 U/L — ABNORMAL HIGH (ref 6–29)
AST: 44 U/L — AB (ref 10–35)
BILIRUBIN TOTAL: 0.6 mg/dL (ref 0.2–1.2)
BUN: 12 mg/dL (ref 7–25)
CALCIUM: 10.1 mg/dL (ref 8.6–10.4)
CHLORIDE: 102 mmol/L (ref 98–110)
CO2: 29 mmol/L (ref 20–32)
CREATININE: 0.83 mg/dL (ref 0.50–1.05)
Globulin: 3 g/dL (calc) (ref 1.9–3.7)
Glucose, Bld: 110 mg/dL — ABNORMAL HIGH (ref 65–99)
POTASSIUM: 4.7 mmol/L (ref 3.5–5.3)
Sodium: 139 mmol/L (ref 135–146)
Total Protein: 7.6 g/dL (ref 6.1–8.1)

## 2017-05-20 LAB — LIPID PANEL
CHOLESTEROL: 219 mg/dL — AB (ref <200)
HDL: 55 mg/dL (ref >50)
LDL Cholesterol (Calc): 137 mg/dL (calc) — ABNORMAL HIGH
Non-HDL Cholesterol (Calc): 164 mg/dL (calc) — ABNORMAL HIGH (ref <130)
Total CHOL/HDL Ratio: 4 (calc) (ref <5.0)
Triglycerides: 143 mg/dL (ref <150)

## 2017-05-20 LAB — CBC WITH DIFFERENTIAL/PLATELET
BASOS ABS: 50 {cells}/uL (ref 0–200)
Basophils Relative: 0.7 %
EOS ABS: 320 {cells}/uL (ref 15–500)
Eosinophils Relative: 4.5 %
HEMATOCRIT: 41.1 % (ref 35.0–45.0)
Hemoglobin: 13.8 g/dL (ref 11.7–15.5)
LYMPHS ABS: 2627 {cells}/uL (ref 850–3900)
MCH: 29.4 pg (ref 27.0–33.0)
MCHC: 33.6 g/dL (ref 32.0–36.0)
MCV: 87.4 fL (ref 80.0–100.0)
MPV: 10.6 fL (ref 7.5–12.5)
Monocytes Relative: 8 %
NEUTROS PCT: 49.8 %
Neutro Abs: 3536 cells/uL (ref 1500–7800)
Platelets: 228 10*3/uL (ref 140–400)
RBC: 4.7 10*6/uL (ref 3.80–5.10)
RDW: 13.7 % (ref 11.0–15.0)
TOTAL LYMPHOCYTE: 37 %
WBC mixed population: 568 cells/uL (ref 200–950)
WBC: 7.1 10*3/uL (ref 3.8–10.8)

## 2017-05-20 LAB — HEPATITIS C ANTIBODY
HEP C AB: NONREACTIVE
SIGNAL TO CUT-OFF: 0.01 (ref <1.00)

## 2017-05-20 LAB — TEST AUTHORIZATION

## 2017-05-20 LAB — TSH: TSH: 2.88 mIU/L (ref 0.40–4.50)

## 2017-05-20 LAB — HIV ANTIBODY (ROUTINE TESTING W REFLEX): HIV 1&2 Ab, 4th Generation: NONREACTIVE

## 2017-05-23 ENCOUNTER — Encounter (INDEPENDENT_AMBULATORY_CARE_PROVIDER_SITE_OTHER): Payer: Self-pay

## 2017-05-25 ENCOUNTER — Encounter: Payer: Self-pay | Admitting: Family Medicine

## 2017-12-09 ENCOUNTER — Other Ambulatory Visit: Payer: BLUE CROSS/BLUE SHIELD

## 2017-12-09 DIAGNOSIS — R5383 Other fatigue: Secondary | ICD-10-CM

## 2017-12-09 DIAGNOSIS — E782 Mixed hyperlipidemia: Secondary | ICD-10-CM

## 2017-12-10 LAB — LIPID PANEL
CHOL/HDL RATIO: 3.4 (calc) (ref <5.0)
CHOLESTEROL: 229 mg/dL — AB (ref <200)
HDL: 68 mg/dL (ref >50)
LDL CHOLESTEROL (CALC): 145 mg/dL — AB
Non-HDL Cholesterol (Calc): 161 mg/dL (calc) — ABNORMAL HIGH (ref <130)
TRIGLYCERIDES: 66 mg/dL (ref <150)

## 2017-12-10 LAB — CBC WITH DIFFERENTIAL/PLATELET
BASOS PCT: 0.7 %
Basophils Absolute: 49 cells/uL (ref 0–200)
EOS PCT: 4.3 %
Eosinophils Absolute: 301 cells/uL (ref 15–500)
HCT: 40.1 % (ref 35.0–45.0)
Hemoglobin: 13.4 g/dL (ref 11.7–15.5)
LYMPHS ABS: 3003 {cells}/uL (ref 850–3900)
MCH: 30 pg (ref 27.0–33.0)
MCHC: 33.4 g/dL (ref 32.0–36.0)
MCV: 89.9 fL (ref 80.0–100.0)
MPV: 10.7 fL (ref 7.5–12.5)
Monocytes Relative: 8.9 %
NEUTROS PCT: 43.2 %
Neutro Abs: 3024 cells/uL (ref 1500–7800)
PLATELETS: 210 10*3/uL (ref 140–400)
RBC: 4.46 10*6/uL (ref 3.80–5.10)
RDW: 12.6 % (ref 11.0–15.0)
TOTAL LYMPHOCYTE: 42.9 %
WBC: 7 10*3/uL (ref 3.8–10.8)
WBCMIX: 623 {cells}/uL (ref 200–950)

## 2017-12-10 LAB — COMPREHENSIVE METABOLIC PANEL
AG RATIO: 1.6 (calc) (ref 1.0–2.5)
ALBUMIN MSPROF: 4.2 g/dL (ref 3.6–5.1)
ALT: 25 U/L (ref 6–29)
AST: 20 U/L (ref 10–35)
Alkaline phosphatase (APISO): 66 U/L (ref 33–130)
BILIRUBIN TOTAL: 0.5 mg/dL (ref 0.2–1.2)
BUN: 19 mg/dL (ref 7–25)
CO2: 26 mmol/L (ref 20–32)
Calcium: 9.3 mg/dL (ref 8.6–10.4)
Chloride: 106 mmol/L (ref 98–110)
Creat: 0.98 mg/dL (ref 0.50–1.05)
Globulin: 2.7 g/dL (calc) (ref 1.9–3.7)
Glucose, Bld: 99 mg/dL (ref 65–99)
POTASSIUM: 4.4 mmol/L (ref 3.5–5.3)
SODIUM: 140 mmol/L (ref 135–146)
Total Protein: 6.9 g/dL (ref 6.1–8.1)

## 2017-12-10 LAB — TSH: TSH: 3.98 mIU/L (ref 0.40–4.50)

## 2017-12-20 ENCOUNTER — Ambulatory Visit (INDEPENDENT_AMBULATORY_CARE_PROVIDER_SITE_OTHER): Payer: BLUE CROSS/BLUE SHIELD | Admitting: Family Medicine

## 2017-12-20 ENCOUNTER — Encounter: Payer: Self-pay | Admitting: Family Medicine

## 2017-12-20 VITALS — BP 126/70 | HR 80 | Temp 98.1°F | Resp 14 | Ht 64.0 in | Wt 197.0 lb

## 2017-12-20 DIAGNOSIS — R739 Hyperglycemia, unspecified: Secondary | ICD-10-CM

## 2017-12-20 DIAGNOSIS — E782 Mixed hyperlipidemia: Secondary | ICD-10-CM | POA: Diagnosis not present

## 2017-12-20 DIAGNOSIS — R945 Abnormal results of liver function studies: Secondary | ICD-10-CM

## 2017-12-20 DIAGNOSIS — R7989 Other specified abnormal findings of blood chemistry: Secondary | ICD-10-CM

## 2017-12-20 NOTE — Progress Notes (Signed)
Subjective:    Patient ID: Shannon Fitzgerald, female    DOB: [birth year], 57 y.o.   MRN: [id number]  HPI In April, at her CPE, her labs revealed sugar of 110, elevated LFT's, cholesterol of 219 and LDL of 137.  Hep C test was negative.  She is here for follow up.  Patient worked hard on diet and exercise.  She lost 9 pounds.  On her most recent lab work, her blood sugar has normalized to 99.  The liver inflammation has resolved.  Unfortunately her cholesterol remains elevated. Lab on 12/09/2017  Component Date Value Ref Range Status  . TSH 12/09/2017 3.98  0.40 - 4.50 mIU/L Final  . Cholesterol 12/09/2017 229* <200 mg/dL Final  . HDL 12/09/2017 68  >50 mg/dL Final  . Triglycerides 12/09/2017 66  <150 mg/dL Final  . LDL Cholesterol (Calc) 12/09/2017 145* mg/dL (calc) Final   Comment: Reference range: <100 . Desirable range <100 mg/dL for primary prevention;   <70 mg/dL for patients with CHD or diabetic patients  with > or = 2 CHD risk factors. Marland Kitchen LDL-C is now calculated using the Martin-Hopkins  calculation, which is a validated novel method providing  better accuracy than the Friedewald equation in the  estimation of LDL-C.  Cresenciano Genre et al. Annamaria Helling. 2637;858(85): 2061-2068  (http://education.[facility].com/faq/FAQ164)   . Total CHOL/HDL Ratio 12/09/2017 3.4  <5.0 (calc) Final  . Non-HDL Cholesterol (Calc) 12/09/2017 161* <130 mg/dL (calc) Final   Comment: For patients with diabetes plus 1 major ASCVD risk  factor, treating to a non-HDL-C goal of <100 mg/dL  (LDL-C of <70 mg/dL) is considered a therapeutic  option.   . Glucose, Bld 12/09/2017 99  65 - 99 mg/dL Final   Comment: .            Fasting reference interval .   . BUN 12/09/2017 19  7 - 25 mg/dL Final  . Creat 12/09/2017 0.98  0.50 - 1.05 mg/dL Final   Comment: For patients >55 years of age, the reference limit for Creatinine is approximately 13% higher for people identified as African-American. .   Havery Moros Ratio 02/77/4128 NOT APPLICABLE  6 - 22 (calc) Final  . Sodium 12/09/2017 140  135 - 146 mmol/L Final  . Potassium 12/09/2017 4.4  3.5 - 5.3 mmol/L Final  . Chloride 12/09/2017 106  98 - 110 mmol/L Final  . CO2 12/09/2017 26  20 - 32 mmol/L Final  . Calcium 12/09/2017 9.3  8.6 - 10.4 mg/dL Final  . Total Protein 12/09/2017 6.9  6.1 - 8.1 g/dL Final  . Albumin 12/09/2017 4.2  3.6 - 5.1 g/dL Final  . Globulin 12/09/2017 2.7  1.9 - 3.7 g/dL (calc) Final  . AG Ratio 12/09/2017 1.6  1.0 - 2.5 (calc) Final  . Total Bilirubin 12/09/2017 0.5  0.2 - 1.2 mg/dL Final  . Alkaline phosphatase (APISO) 12/09/2017 66  33 - 130 U/L Final  . AST 12/09/2017 20  10 - 35 U/L Final  . ALT 12/09/2017 25  6 - 29 U/L Final  . WBC 12/09/2017 7.0  3.8 - 10.8 Thousand/uL Final  . RBC 12/09/2017 4.46  3.80 - 5.10 Million/uL Final  . Hemoglobin 12/09/2017 13.4  11.7 - 15.5 g/dL Final  . HCT 12/09/2017 40.1  35.0 - 45.0 % Final  . MCV 12/09/2017 89.9  80.0 - 100.0 fL Final  . MCH 12/09/2017 30.0  27.0 - 33.0 pg Final  . MCHC 12/09/2017 33.4  32.0 - 36.0  g/dL Final  . RDW 12/09/2017 12.6  11.0 - 15.0 % Final  . Platelets 12/09/2017 210  140 - 400 Thousand/uL Final  . MPV 12/09/2017 10.7  7.5 - 12.5 fL Final  . Neutro Abs 12/09/2017 3,024  1,500 - 7,800 cells/uL Final  . Lymphs Abs 12/09/2017 3,003  850 - 3,900 cells/uL Final  . WBC mixed population 12/09/2017 623  200 - 950 cells/uL Final  . Eosinophils Absolute 12/09/2017 301  15 - 500 cells/uL Final  . Basophils Absolute 12/09/2017 49  0 - 200 cells/uL Final  . Neutrophils Relative % 12/09/2017 43.2  % Final  . Total Lymphocyte 12/09/2017 42.9  % Final  . Monocytes Relative 12/09/2017 8.9  % Final  . Eosinophils Relative 12/09/2017 4.3  % Final  . Basophils Relative 12/09/2017 0.7  % Final   Immunization History  Administered Date(s) Administered  . Hepatitis B 12/02/2005, 01/03/2006, 03/28/2006  . IPV 03/10/1999  . Influenza-Unspecified  11/09/2015  . MMR 07/28/2004  . Td 03/10/1999  . Tdap 09/05/2014    Past Medical History:  Diagnosis Date  . Allergy   . Medical history non-contributory   . Varicose veins    Past Surgical History:  Procedure Laterality Date  . ATHERECTOMY  1988   had DVT lt leg during pregnancy-clot removed in [place]  . LASER ABLATION Left   . ORIF RADIAL FRACTURE Left 07/27/2012   Procedure: OPEN REDUCTION INTERNAL FIXATION (ORIF) RADIAL FRACTURE;  Surgeon: Cammie Sickle., MD;  Location: [facility];  Service: Orthopedics;  Laterality: Left;  . TUBAL LIGATION     Current Outpatient Medications on File Prior to Visit  Medication Sig Dispense Refill  . Cholecalciferol (VITAMIN D) 2000 UNITS tablet Take 2,000 Units by mouth daily.    . cyclobenzaprine (FLEXERIL) 10 MG tablet Take 1 tablet (10 mg total) by mouth 3 (three) times daily as needed for muscle spasms. (Patient not taking: Reported on 05/19/2017) 30 tablet 0  . diclofenac (VOLTAREN) 75 MG EC tablet Take 1 tablet (75 mg total) by mouth 2 (two) times daily. (Patient not taking: Reported on 05/19/2017) 30 tablet 0  . Multiple Vitamin (MULTIVITAMIN WITH MINERALS) TABS tablet Take 1 tablet by mouth daily.    Marland Kitchen omeprazole (PRILOSEC) 20 MG capsule Take 1 capsule (20 mg total) by mouth daily. 30 capsule 0   No current facility-administered medications on file prior to visit.    No Known Allergies Social History   Socioeconomic History  . Marital status: Married    Spouse name: Not on file  . Number of children: Not on file  . Years of education: Not on file  . Highest education level: Not on file  Occupational History  . Not on file  Social Needs  . Financial resource strain: Not on file  . Food insecurity:    Worry: Not on file    Inability: Not on file  . Transportation needs:    Medical: Not on file    Non-medical: Not on file  Tobacco Use  . Smoking status: Former Smoker    Last attempt to quit: 02/09/1980     Years since quitting: 37.8  . Smokeless tobacco: Never Used  Substance and Sexual Activity  . Alcohol use: Yes    Alcohol/week: 0.0 standard drinks    Comment: occ  . Drug use: No  . Sexual activity: Yes    Comment: married, 3 sons.  Lifestyle  . Physical activity:    Days per  week: Not on file    Minutes per session: Not on file  . Stress: Not on file  Relationships  . Social connections:    Talks on phone: Not on file    Gets together: Not on file    Attends religious service: Not on file    Active member of club or organization: Not on file    Attends meetings of clubs or organizations: Not on file    Relationship status: Not on file  . Intimate partner violence:    Fear of current or ex partner: Not on file    Emotionally abused: Not on file    Physically abused: Not on file    Forced sexual activity: Not on file  Other Topics Concern  . Not on file  Social History Narrative  . Not on file   Family History  Problem Relation Age of Onset  . Diabetes Mother   . Glaucoma Mother   . Hypertension Mother   . Diabetes Father   . Heart disease Brother 10  . Diabetes Brother      Review of Systems  All other systems reviewed and are negative.      Objective:   Physical Exam  Constitutional: She is oriented to person, place, and time. She appears well-developed and well-nourished. No distress.  HENT:  Head: Normocephalic and atraumatic.  Right Ear: External ear normal.  Left Ear: External ear normal.  Nose: Nose normal.  Mouth/Throat: Oropharynx is clear and moist. No oropharyngeal exudate.  Eyes: Pupils are equal, round, and reactive to light. Conjunctivae and EOM are normal. Right eye exhibits no discharge. Left eye exhibits no discharge. No scleral icterus.  Neck: Normal range of motion. Neck supple. No JVD present. No tracheal deviation present. No thyromegaly present.  Cardiovascular: Normal rate, regular rhythm and intact distal pulses. Exam reveals no  gallop and no friction rub.  Murmur heard. Pulmonary/Chest: Effort normal and breath sounds normal. No stridor. No respiratory distress. She has no wheezes. She has no rales. She exhibits no tenderness.  Abdominal: Soft. Bowel sounds are normal. She exhibits no distension and no mass. There is no tenderness. There is no rebound and no guarding.  Musculoskeletal: Normal range of motion. She exhibits no edema or tenderness.  Lymphadenopathy:    She has no cervical adenopathy.  Neurological: She is alert and oriented to person, place, and time. She has normal reflexes. No cranial nerve deficit. She exhibits normal muscle tone. Coordination normal.  Skin: Skin is warm. No rash noted. She is not diaphoretic. No erythema. No pallor.  Psychiatric: She has a normal mood and affect. Her behavior is normal. Judgment and thought content normal.  Vitals reviewed.         Assessment & Plan:  Mixed hyperlipidemia  Hyperglycemia  Elevated LFTs  I am very proud of the patient for correcting her underlying prediabetes along with her hepatic steatosis.  I encouraged her to keep up her lifestyle changes and to try to lose an additional 10 pounds.  However I would treat her hyperlipidemia with Fish oil 2000 mg daily and recheck levels in 6 months.  Encouraged healthy diet, 30 minutes of exercise daily, and weight loss.

## 2017-12-29 DIAGNOSIS — N951 Menopausal and female climacteric states: Secondary | ICD-10-CM | POA: Insufficient documentation

## 2018-01-02 ENCOUNTER — Other Ambulatory Visit: Payer: Self-pay | Admitting: Obstetrics and Gynecology

## 2018-01-02 DIAGNOSIS — R928 Other abnormal and inconclusive findings on diagnostic imaging of breast: Secondary | ICD-10-CM

## 2018-01-04 ENCOUNTER — Ambulatory Visit
Admission: RE | Admit: 2018-01-04 | Discharge: 2018-01-04 | Disposition: A | Discharge status: Home / Self Care | Payer: BLUE CROSS/BLUE SHIELD | Source: Ambulatory Visit | Attending: Obstetrics and Gynecology | Admitting: Obstetrics and Gynecology

## 2018-01-04 DIAGNOSIS — R928 Other abnormal and inconclusive findings on diagnostic imaging of breast: Secondary | ICD-10-CM

## 2019-02-14 ENCOUNTER — Other Ambulatory Visit: Payer: BC Managed Care – PPO

## 2019-02-14 ENCOUNTER — Other Ambulatory Visit: Payer: Self-pay

## 2019-02-14 DIAGNOSIS — Z0000 Encounter for general adult medical examination without abnormal findings: Secondary | ICD-10-CM

## 2019-02-15 LAB — LIPID PANEL
Cholesterol: 213 mg/dL — ABNORMAL HIGH (ref <200)
HDL: 59 mg/dL (ref >=50)
LDL Cholesterol (Calc): 136 mg/dL (calc) — ABNORMAL HIGH
Non-HDL Cholesterol (Calc): 154 mg/dL (calc) — ABNORMAL HIGH (ref <130)
Total CHOL/HDL Ratio: 3.6 (calc) (ref <5.0)
Triglycerides: 84 mg/dL (ref <150)

## 2019-02-15 LAB — COMPREHENSIVE METABOLIC PANEL
AG Ratio: 1.4 (calc) (ref 1.0–2.5)
ALT: 41 U/L — ABNORMAL HIGH (ref 6–29)
AST: 25 U/L (ref 10–35)
Albumin: 4.3 g/dL (ref 3.6–5.1)
Alkaline phosphatase (APISO): 67 U/L (ref 37–153)
BUN: 15 mg/dL (ref 7–25)
CO2: 27 mmol/L (ref 20–32)
Calcium: 9.8 mg/dL (ref 8.6–10.4)
Chloride: 104 mmol/L (ref 98–110)
Creat: 0.96 mg/dL (ref 0.50–1.05)
Globulin: 3 g/dL (calc) (ref 1.9–3.7)
Glucose, Bld: 102 mg/dL — ABNORMAL HIGH (ref 65–99)
Potassium: 5 mmol/L (ref 3.5–5.3)
Sodium: 139 mmol/L (ref 135–146)
Total Bilirubin: 0.5 mg/dL (ref 0.2–1.2)
Total Protein: 7.3 g/dL (ref 6.1–8.1)

## 2019-02-15 LAB — CBC WITH DIFFERENTIAL/PLATELET
Absolute Monocytes: 520 cells/uL (ref 200–950)
Basophils Absolute: 39 cells/uL (ref 0–200)
Basophils Relative: 0.6 %
Eosinophils Absolute: 182 cells/uL (ref 15–500)
Eosinophils Relative: 2.8 %
HCT: 41.3 % (ref 35.0–45.0)
Hemoglobin: 13.6 g/dL (ref 11.7–15.5)
Lymphs Abs: 3062 cells/uL (ref 850–3900)
MCH: 29.9 pg (ref 27.0–33.0)
MCHC: 32.9 g/dL (ref 32.0–36.0)
MCV: 90.8 fL (ref 80.0–100.0)
MPV: 10.6 fL (ref 7.5–12.5)
Monocytes Relative: 8 %
Neutro Abs: 2698 cells/uL (ref 1500–7800)
Neutrophils Relative %: 41.5 %
Platelets: 223 10*3/uL (ref 140–400)
RBC: 4.55 10*6/uL (ref 3.80–5.10)
RDW: 13.1 % (ref 11.0–15.0)
Total Lymphocyte: 47.1 %
WBC: 6.5 10*3/uL (ref 3.8–10.8)

## 2019-02-16 ENCOUNTER — Encounter: Payer: BLUE CROSS/BLUE SHIELD | Admitting: Family Medicine

## 2019-04-09 ENCOUNTER — Ambulatory Visit (INDEPENDENT_AMBULATORY_CARE_PROVIDER_SITE_OTHER): Payer: BC Managed Care – PPO | Admitting: Family Medicine

## 2019-04-09 ENCOUNTER — Other Ambulatory Visit: Payer: Self-pay

## 2019-04-09 ENCOUNTER — Encounter: Payer: Self-pay | Admitting: Family Medicine

## 2019-04-09 VITALS — BP 110/70 | HR 77 | Temp 96.6°F | Resp 16 | Ht 64.0 in | Wt 188.0 lb

## 2019-04-09 DIAGNOSIS — R7989 Other specified abnormal findings of blood chemistry: Secondary | ICD-10-CM

## 2019-04-09 DIAGNOSIS — Z0000 Encounter for general adult medical examination without abnormal findings: Secondary | ICD-10-CM | POA: Diagnosis not present

## 2019-04-09 DIAGNOSIS — R739 Hyperglycemia, unspecified: Secondary | ICD-10-CM

## 2019-04-09 DIAGNOSIS — E782 Mixed hyperlipidemia: Secondary | ICD-10-CM | POA: Diagnosis not present

## 2019-04-09 NOTE — Progress Notes (Signed)
Subjective:    Patient ID: Shannon Fitzgerald, female    DOB: 10-30-1960, 59 y.o.   MRN: [id number]  HPI Patient is here today for complete physical exam. She believes her last colonoscopy was at age 63 and would be due again in 2022.  She gets her pap, pelvic, and mammogram at [facility].  She has already scheduled her mammogram and pelvic exam for later this year at her gynecologist.  Otherwise she is doing well.  She is due for a flu shot but she declines this today.  She is not due for the shingles shot until age 35.  We also discussed how to schedule the Covid vaccination and I provided her with contact information to do this.  She is not getting regular aerobic exercise. Lab on 02/14/2019  Component Date Value Ref Range Status  . Glucose, Bld 02/14/2019 102* 65 - 99 mg/dL Final   Comment: .            Fasting reference interval . For someone without known diabetes, a glucose value between 100 and 125 mg/dL is consistent with prediabetes and should be confirmed with a follow-up test. .   . BUN 02/14/2019 15  7 - 25 mg/dL Final  . Creat 02/14/2019 0.96  0.50 - 1.05 mg/dL Final   Comment: For patients >77 years of age, the reference limit for Creatinine is approximately 13% higher for people identified as African-American. .   Havery Moros Ratio 46/27/0350 NOT APPLICABLE  6 - 22 (calc) Final  . Sodium 02/14/2019 139  135 - 146 mmol/L Final  . Potassium 02/14/2019 5.0  3.5 - 5.3 mmol/L Final  . Chloride 02/14/2019 104  98 - 110 mmol/L Final  . CO2 02/14/2019 27  20 - 32 mmol/L Final  . Calcium 02/14/2019 9.8  8.6 - 10.4 mg/dL Final  . Total Protein 02/14/2019 7.3  6.1 - 8.1 g/dL Final  . Albumin 02/14/2019 4.3  3.6 - 5.1 g/dL Final  . Globulin 02/14/2019 3.0  1.9 - 3.7 g/dL (calc) Final  . AG Ratio 02/14/2019 1.4  1.0 - 2.5 (calc) Final  . Total Bilirubin 02/14/2019 0.5  0.2 - 1.2 mg/dL Final  . Alkaline phosphatase (APISO) 02/14/2019 67  37 - 153 U/L Final  . AST 02/14/2019 25  10  - 35 U/L Final  . ALT 02/14/2019 41* 6 - 29 U/L Final  . WBC 02/14/2019 6.5  3.8 - 10.8 Thousand/uL Final  . RBC 02/14/2019 4.55  3.80 - 5.10 Million/uL Final  . Hemoglobin 02/14/2019 13.6  11.7 - 15.5 g/dL Final  . HCT 02/14/2019 41.3  35.0 - 45.0 % Final  . MCV 02/14/2019 90.8  80.0 - 100.0 fL Final  . MCH 02/14/2019 29.9  27.0 - 33.0 pg Final  . MCHC 02/14/2019 32.9  32.0 - 36.0 g/dL Final  . RDW 02/14/2019 13.1  11.0 - 15.0 % Final  . Platelets 02/14/2019 223  140 - 400 Thousand/uL Final  . MPV 02/14/2019 10.6  7.5 - 12.5 fL Final  . Neutro Abs 02/14/2019 2,698  1,500 - 7,800 cells/uL Final  . Lymphs Abs 02/14/2019 3,062  850 - 3,900 cells/uL Final  . Absolute Monocytes 02/14/2019 520  200 - 950 cells/uL Final  . Eosinophils Absolute 02/14/2019 182  15 - 500 cells/uL Final  . Basophils Absolute 02/14/2019 39  0 - 200 cells/uL Final  . Neutrophils Relative % 02/14/2019 41.5  % Final  . Total Lymphocyte 02/14/2019 47.1  % Final  .  Monocytes Relative 02/14/2019 8.0  % Final  . Eosinophils Relative 02/14/2019 2.8  % Final  . Basophils Relative 02/14/2019 0.6  % Final  . Cholesterol 02/14/2019 213* <200 mg/dL Final  . HDL 02/14/2019 59  > OR = 50 mg/dL Final  . Triglycerides 02/14/2019 84  <150 mg/dL Final  . LDL Cholesterol (Calc) 02/14/2019 136* mg/dL (calc) Final   Comment: Reference range: <100 . Desirable range <100 mg/dL for primary prevention;   <70 mg/dL for patients with CHD or diabetic patients  with > or = 2 CHD risk factors. Marland Kitchen LDL-C is now calculated using the Martin-Hopkins  calculation, which is a validated novel method providing  better accuracy than the Friedewald equation in the  estimation of LDL-C.  Cresenciano Genre et al. Annamaria Helling. 3810;175(10): 2061-2068  (http://education.[facility].com/faq/FAQ164)   . Total CHOL/HDL Ratio 02/14/2019 3.6  <5.0 (calc) Final  . Non-HDL Cholesterol (Calc) 02/14/2019 154* <130 mg/dL (calc) Final   Comment: For patients with  diabetes plus 1 major ASCVD risk  factor, treating to a non-HDL-C goal of <100 mg/dL  (LDL-C of <70 mg/dL) is considered a therapeutic  option.     Immunization History  Administered Date(s) Administered  . Hepatitis B 12/02/2005, 01/03/2006, 03/28/2006  . IPV 03/10/1999  . Influenza-Unspecified 11/09/2015  . MMR 07/28/2004  . Td 03/10/1999  . Tdap 09/05/2014    Past Medical History:  Diagnosis Date  . Allergy   . Medical history non-contributory   . Varicose veins    Past Surgical History:  Procedure Laterality Date  . ATHERECTOMY  1988   had DVT lt leg during pregnancy-clot removed in [place]  . LASER ABLATION Left   . ORIF RADIAL FRACTURE Left 07/27/2012   Procedure: OPEN REDUCTION INTERNAL FIXATION (ORIF) RADIAL FRACTURE;  Surgeon: Cammie Sickle., MD;  Location: [facility];  Service: Orthopedics;  Laterality: Left;  . TUBAL LIGATION     Current Outpatient Medications on File Prior to Visit  Medication Sig Dispense Refill  . Cholecalciferol (VITAMIN D) 2000 UNITS tablet Take 2,000 Units by mouth daily.    . Multiple Vitamin (MULTIVITAMIN WITH MINERALS) TABS tablet Take 1 tablet by mouth daily.     No current facility-administered medications on file prior to visit.   No Known Allergies Social History   Socioeconomic History  . Marital status: Married    Spouse name: Not on file  . Number of children: Not on file  . Years of education: Not on file  . Highest education level: Not on file  Occupational History  . Not on file  Tobacco Use  . Smoking status: Former Smoker    Quit date: 02/09/1980    Years since quitting: 39.1  . Smokeless tobacco: Never Used  Substance and Sexual Activity  . Alcohol use: Yes    Alcohol/week: 0.0 standard drinks    Comment: occ  . Drug use: No  . Sexual activity: Yes    Comment: married, 3 sons.  Other Topics Concern  . Not on file  Social History Narrative  . Not on file   Social Determinants of Health    Financial Resource Strain:   . Difficulty of Paying Living Expenses: Not on file  Food Insecurity:   . Worried About Charity fundraiser in the Last Year: Not on file  . Ran Out of Food in the Last Year: Not on file  Transportation Needs:   . Lack of Transportation (Medical): Not on file  . Lack  of Transportation (Non-Medical): Not on file  Physical Activity:   . Days of Exercise per Week: Not on file  . Minutes of Exercise per Session: Not on file  Stress:   . Feeling of Stress : Not on file  Social Connections:   . Frequency of Communication with Friends and Family: Not on file  . Frequency of Social Gatherings with Friends and Family: Not on file  . Attends Religious Services: Not on file  . Active Member of Clubs or Organizations: Not on file  . Attends Archivist Meetings: Not on file  . Marital Status: Not on file  Intimate Partner Violence:   . Fear of Current or Ex-Partner: Not on file  . Emotionally Abused: Not on file  . Physically Abused: Not on file  . Sexually Abused: Not on file   Family History  Problem Relation Age of Onset  . Diabetes Mother   . Glaucoma Mother   . Hypertension Mother   . Diabetes Father   . Heart disease Brother 22  . Diabetes Brother      Review of Systems  All other systems reviewed and are negative.      Objective:   Physical Exam  Constitutional: She is oriented to person, place, and time. She appears well-developed and well-nourished. No distress.  HENT:  Head: Normocephalic and atraumatic.  Right Ear: External ear normal.  Left Ear: External ear normal.  Nose: Nose normal.  Mouth/Throat: Oropharynx is clear and moist. No oropharyngeal exudate.  Eyes: Pupils are equal, round, and reactive to light. Conjunctivae and EOM are normal. Right eye exhibits no discharge. Left eye exhibits no discharge. No scleral icterus.  Neck: No JVD present. No tracheal deviation present. No thyromegaly present.  Cardiovascular:  Normal rate, regular rhythm and intact distal pulses. Exam reveals no gallop and no friction rub.  Murmur heard. Pulmonary/Chest: Effort normal and breath sounds normal. No stridor. No respiratory distress. She has no wheezes. She has no rales. She exhibits no tenderness.  Abdominal: Soft. Bowel sounds are normal. She exhibits no distension and no mass. There is no abdominal tenderness. There is no rebound and no guarding.  Musculoskeletal:        General: No tenderness or edema. Normal range of motion.     Cervical back: Normal range of motion and neck supple.  Lymphadenopathy:    She has no cervical adenopathy.  Neurological: She is alert and oriented to person, place, and time. She has normal reflexes. No cranial nerve deficit. She exhibits normal muscle tone. Coordination normal.  Skin: Skin is warm. No rash noted. She is not diaphoretic. No erythema. No pallor.  Psychiatric: She has a normal mood and affect. Her behavior is normal. Judgment and thought content normal.  Vitals reviewed.         Assessment & Plan:  Mixed hyperlipidemia  Elevated LFTs  Routine general medical examination at a health care facility  Hyperglycemia  Blood pressure is excellent.  Biggest concern right now is mild signs of metabolic syndrome.  Blood sugar is slightly elevated.  There are mild elevations in liver function tests.  LDL cholesterol is mildly elevated along with triglycerides.  I have recommended 30 minutes a day 5 days a week of aerobic exercise.  I recommended 5 to 10 pounds of weight loss.  I recommended a diet rich in fruits and vegetables and low in saturated fat.  I believe that the patient could lose 5 to 10 pounds her lab values  were normalized.  Regular anticipatory guidance is provided.  Offered the patient a flu shot but she politely declined.  Will defer her Pap smear and her mammogram to her gynecologist.

## 2019-04-18 LAB — HM PAP SMEAR

## 2019-04-18 LAB — HM MAMMOGRAPHY

## 2019-04-18 LAB — RESULTS CONSOLE HPV: CHL HPV: NEGATIVE

## 2019-07-06 IMAGING — [facility] DIGITAL DIAGNOSTIC UNILATERAL LEFT MAMMOGRAM WITH TOMO AND CAD
4 series · 4 of 12 positions shown · non-contrast
Comparison: Previous exam(s).

CLINICAL DATA: Screening recall for a possible asymmetry in the
left breast.

EXAM:
DIGITAL DIAGNOSTIC LEFT MAMMOGRAM WITH CAD AND TOMO
ULTRASOUND LEFT BREAST

[L MLO synth-2D]
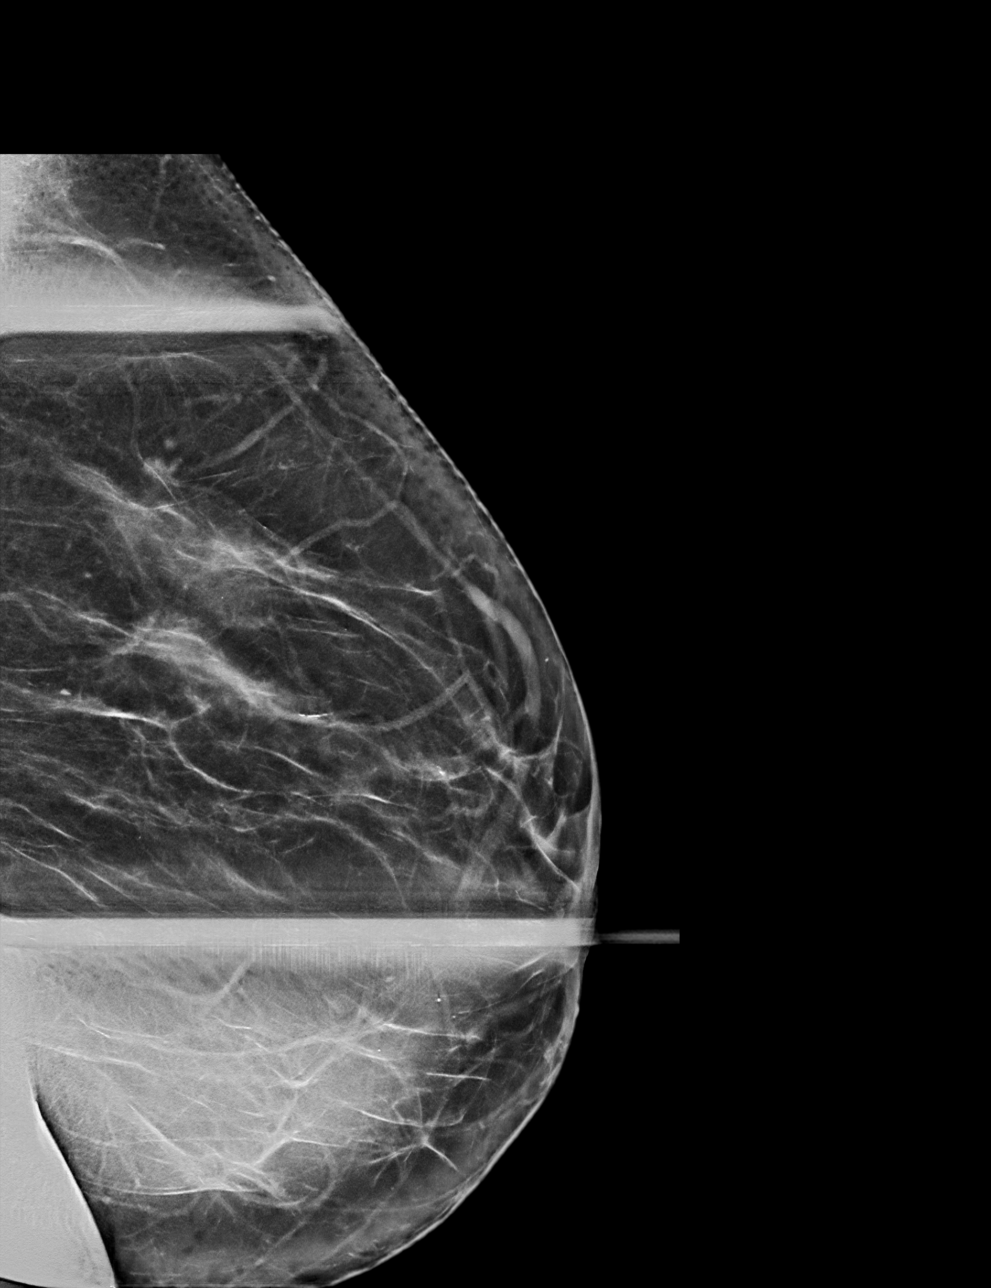

[L CC synth-2D]
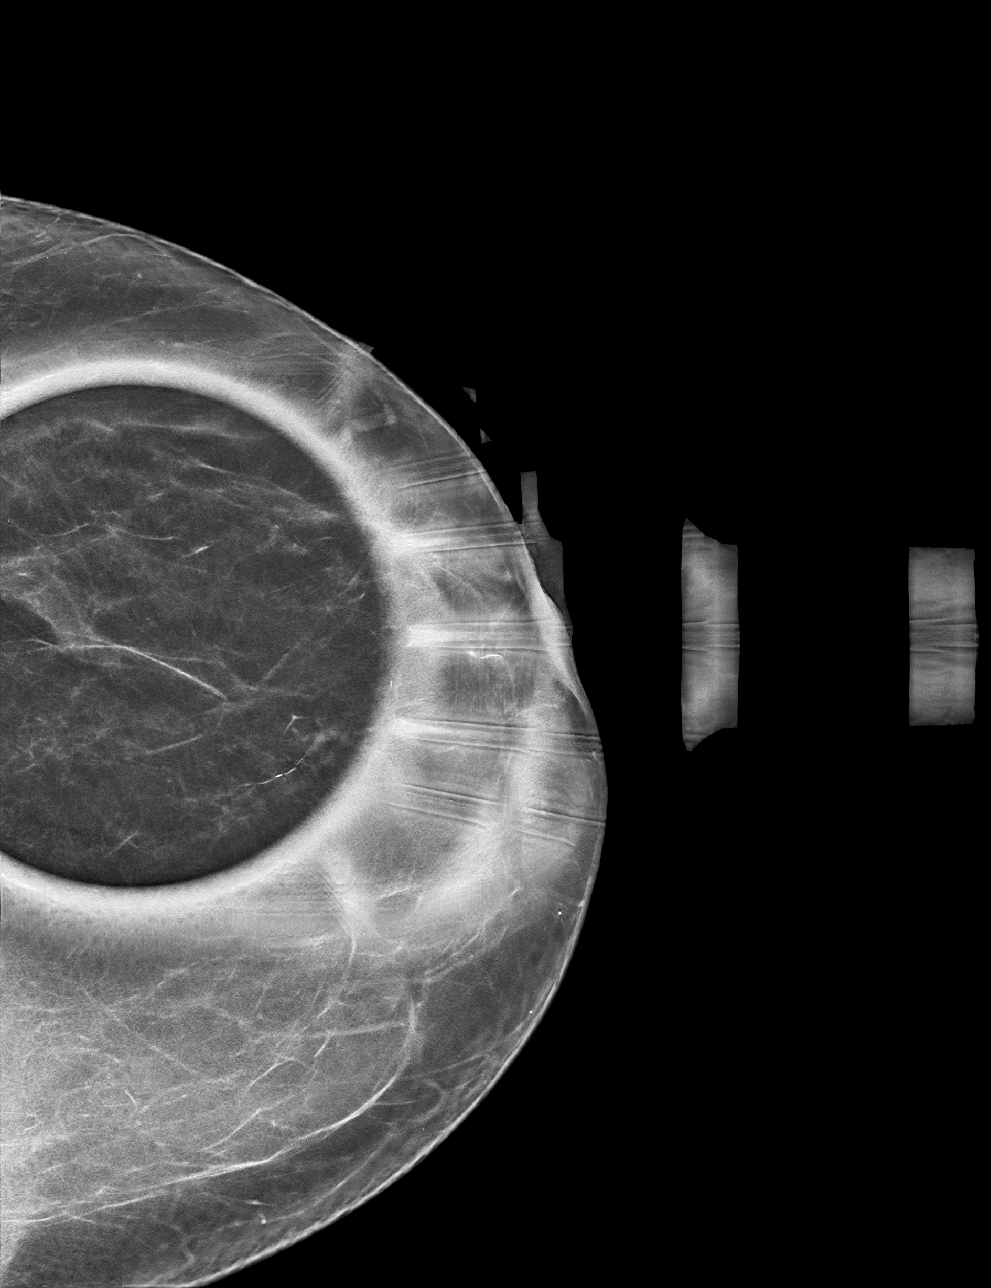

[L CC tomo · tomo slice 29/56.0]
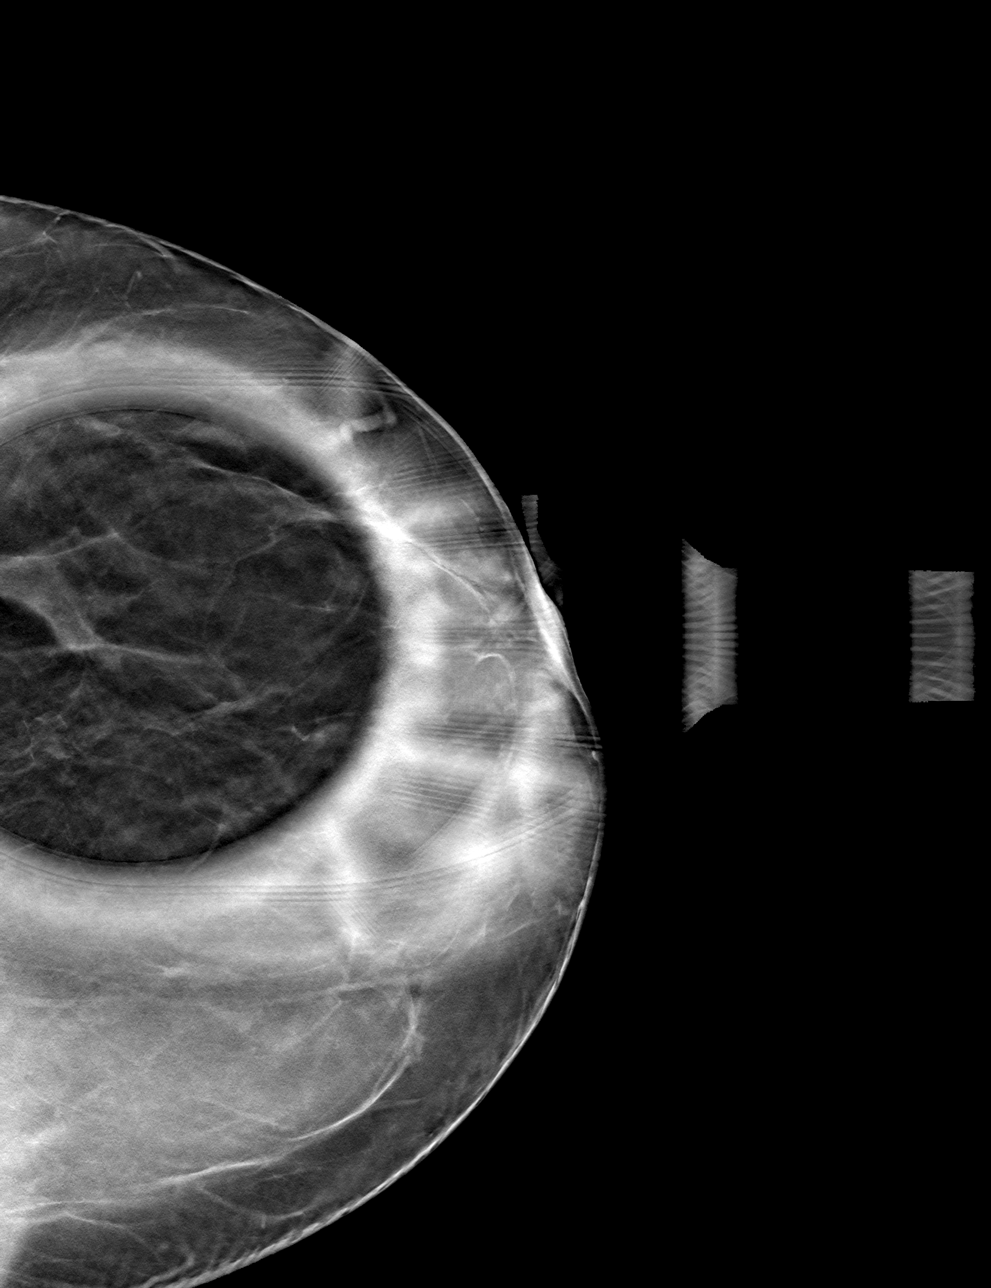

[L MLO tomo · tomo slice 35/70.0]
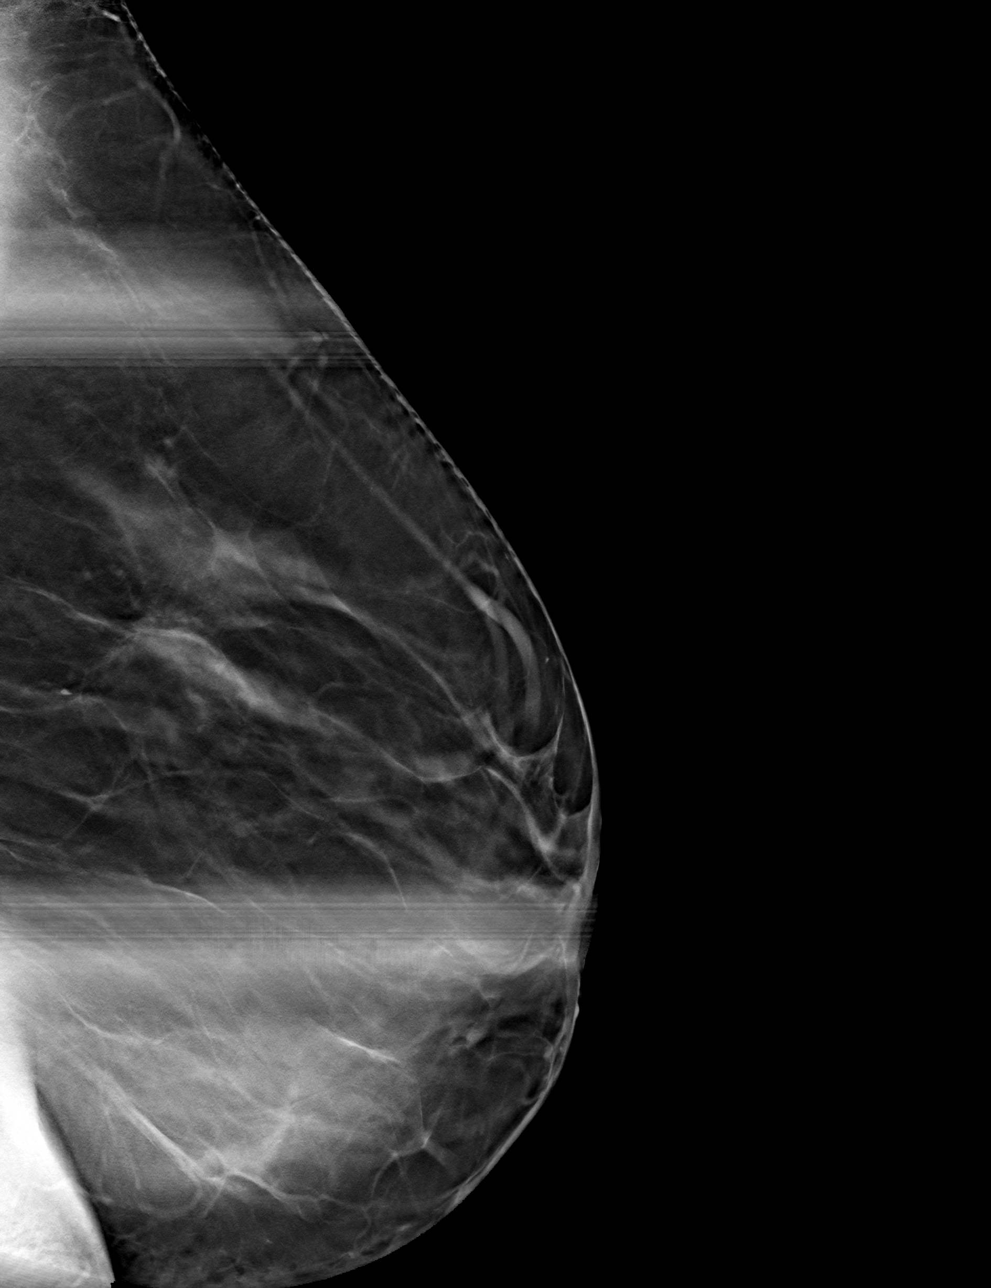

[4 of 12 positions shown; findings below may reference images not displayed]

ACR Breast Density Category b: There are scattered areas of
fibroglandular density.
FINDINGS: On the diagnostic images, the possible asymmetry, which was
primarily noted on the screening CC view, disperses consistent with
superimposed fibroglandular tissue. There is no defined mass or
significant residual asymmetry. There are no areas of architectural
distortion and there are no suspicious calcifications.

Mammographic images were processed with CAD.

On physical exam, no mass is palpated in the lateral left breast.

Targeted ultrasound is performed, showing an oval simple cyst in the
left breast at 3 o'clock, 4 cm the nipple, measuring 10 x 3 x 6 mm.
No solid masses or suspicious lesions. The entire lateral left
breast was insonated.
IMPRESSION: 1. No evidence of breast malignancy.
2. Benign left breast cyst.

RECOMMENDATION:
Screening mammogram in one year.(Code:[id number])

I have discussed the findings and recommendations with the patient.
Results were also provided in writing at the conclusion of the
visit. If applicable, a reminder letter will be sent to the patient
regarding the next appointment.

BI-RADS CATEGORY  2: Benign.

## 2020-07-21 ENCOUNTER — Other Ambulatory Visit: Payer: Self-pay

## 2020-07-21 ENCOUNTER — Other Ambulatory Visit: Payer: 59

## 2020-07-21 DIAGNOSIS — E782 Mixed hyperlipidemia: Secondary | ICD-10-CM

## 2020-07-21 DIAGNOSIS — E559 Vitamin D deficiency, unspecified: Secondary | ICD-10-CM

## 2020-07-22 LAB — COMPLETE METABOLIC PANEL WITH GFR
AG Ratio: 1.4 (calc) (ref 1.0–2.5)
ALT: 34 U/L — ABNORMAL HIGH (ref 6–29)
AST: 24 U/L (ref 10–35)
Albumin: 4.2 g/dL (ref 3.6–5.1)
Alkaline phosphatase (APISO): 63 U/L (ref 37–153)
BUN: 13 mg/dL (ref 7–25)
CO2: 27 mmol/L (ref 20–32)
Calcium: 9.3 mg/dL (ref 8.6–10.4)
Chloride: 104 mmol/L (ref 98–110)
Creat: 0.89 mg/dL (ref 0.50–1.05)
GFR, Est African American: 82 mL/min/{1.73_m2} (ref >=60)
GFR, Est Non African American: 71 mL/min/{1.73_m2} (ref >=60)
Globulin: 2.9 g/dL (calc) (ref 1.9–3.7)
Glucose, Bld: 112 mg/dL — ABNORMAL HIGH (ref 65–99)
Potassium: 4.8 mmol/L (ref 3.5–5.3)
Sodium: 138 mmol/L (ref 135–146)
Total Bilirubin: 0.5 mg/dL (ref 0.2–1.2)
Total Protein: 7.1 g/dL (ref 6.1–8.1)

## 2020-07-22 LAB — CBC WITH DIFFERENTIAL/PLATELET
Absolute Monocytes: 468 cells/uL (ref 200–950)
Basophils Absolute: 42 cells/uL (ref 0–200)
Basophils Relative: 0.7 %
Eosinophils Absolute: 108 cells/uL (ref 15–500)
Eosinophils Relative: 1.8 %
HCT: 42.1 % (ref 35.0–45.0)
Hemoglobin: 13.8 g/dL (ref 11.7–15.5)
Lymphs Abs: 2118 cells/uL (ref 850–3900)
MCH: 29.9 pg (ref 27.0–33.0)
MCHC: 32.8 g/dL (ref 32.0–36.0)
MCV: 91.1 fL (ref 80.0–100.0)
MPV: 10.9 fL (ref 7.5–12.5)
Monocytes Relative: 7.8 %
Neutro Abs: 3264 cells/uL (ref 1500–7800)
Neutrophils Relative %: 54.4 %
Platelets: 194 10*3/uL (ref 140–400)
RBC: 4.62 10*6/uL (ref 3.80–5.10)
RDW: 13 % (ref 11.0–15.0)
Total Lymphocyte: 35.3 %
WBC: 6 10*3/uL (ref 3.8–10.8)

## 2020-07-22 LAB — HEMOGLOBIN A1C
Hgb A1c MFr Bld: 6.3 % of total Hgb — ABNORMAL HIGH (ref <5.7)
Mean Plasma Glucose: 134 mg/dL
eAG (mmol/L): 7.4 mmol/L

## 2020-07-22 LAB — LIPID PANEL
Cholesterol: 204 mg/dL — ABNORMAL HIGH (ref <200)
HDL: 64 mg/dL (ref >=50)
LDL Cholesterol (Calc): 125 mg/dL (calc) — ABNORMAL HIGH
Non-HDL Cholesterol (Calc): 140 mg/dL (calc) — ABNORMAL HIGH (ref <130)
Total CHOL/HDL Ratio: 3.2 (calc) (ref <5.0)
Triglycerides: 60 mg/dL (ref <150)

## 2020-07-22 LAB — TSH: TSH: 1.89 mIU/L (ref 0.40–4.50)

## 2020-07-25 ENCOUNTER — Encounter: Payer: Self-pay | Admitting: *Deleted

## 2020-07-25 ENCOUNTER — Telehealth: Payer: Self-pay

## 2020-07-25 ENCOUNTER — Encounter: Payer: Self-pay | Admitting: Family Medicine

## 2020-07-25 ENCOUNTER — Ambulatory Visit (INDEPENDENT_AMBULATORY_CARE_PROVIDER_SITE_OTHER): Payer: 59 | Admitting: Family Medicine

## 2020-07-25 ENCOUNTER — Other Ambulatory Visit: Payer: Self-pay

## 2020-07-25 ENCOUNTER — Other Ambulatory Visit: Payer: Self-pay | Admitting: Family Medicine

## 2020-07-25 VITALS — BP 122/70 | HR 70 | Temp 99.0°F | Resp 16 | Ht 64.0 in | Wt 217.0 lb

## 2020-07-25 DIAGNOSIS — Z1211 Encounter for screening for malignant neoplasm of colon: Secondary | ICD-10-CM

## 2020-07-25 DIAGNOSIS — I82409 Acute embolism and thrombosis of unspecified deep veins of unspecified lower extremity: Secondary | ICD-10-CM | POA: Insufficient documentation

## 2020-07-25 DIAGNOSIS — E782 Mixed hyperlipidemia: Secondary | ICD-10-CM | POA: Diagnosis not present

## 2020-07-25 DIAGNOSIS — E669 Obesity, unspecified: Secondary | ICD-10-CM

## 2020-07-25 DIAGNOSIS — Z0001 Encounter for general adult medical examination with abnormal findings: Secondary | ICD-10-CM | POA: Diagnosis not present

## 2020-07-25 DIAGNOSIS — Z0000 Encounter for general adult medical examination without abnormal findings: Secondary | ICD-10-CM

## 2020-07-25 DIAGNOSIS — R7303 Prediabetes: Secondary | ICD-10-CM

## 2020-07-25 MED ORDER — WEGOVY 0.5 MG/0.5ML ~~LOC~~ SOAJ: 0.5000 mg | SUBCUTANEOUS | 1 refills | Status: DC

## 2020-07-25 MED ORDER — MELOXICAM 15 MG PO TABS: 15.0000 mg | ORAL_TABLET | Freq: Every day | ORAL | 0 refills | Status: DC

## 2020-07-25 NOTE — Progress Notes (Signed)
Subjective:    Patient ID: Shannon Fitzgerald, female    DOB: 10-04-1960, 60 y.o.   MRN: [id number]  HPI Patient is here today for complete physical exam. She believes her last colonoscopy was at age 47 and is due again now.  She gets her pap, pelvic, and mammogram at [facility].  Her most recent lab work is listed below and concerning findings include an A1c of 6.3, fasting blood sugar of 112, mild fatty liver disease, and an elevated LDL cholesterol.  Her BMI is elevated at 37.25.  She is been unable to exercise due to a right lateral meniscal tear and severe knee pain for which she is seeing orthopedics.  She is trying to work on her diet and has been consuming 1500 cal a day and trying to eat a low-carb diet but is seen no success with regards to weight loss Lab on 07/21/2020  Component Date Value Ref Range Status   Hgb A1c MFr Bld 07/21/2020 6.3 (A) <5.7 % of total Hgb Final   Comment: For someone without known diabetes, a hemoglobin  A1c value between 5.7% and 6.4% is consistent with prediabetes and should be confirmed with a  follow-up test. . For someone with known diabetes, a value <7% indicates that their diabetes is well controlled. A1c targets should be individualized based on duration of diabetes, age, comorbid conditions, and other considerations. . This assay result is consistent with an increased risk of diabetes. . Currently, no consensus exists regarding use of hemoglobin A1c for diagnosis of diabetes for children. .    Mean Plasma Glucose 07/21/2020 134  mg/dL Final   eAG (mmol/L) 07/21/2020 7.4  mmol/L Final   Cholesterol 07/21/2020 204 (A) <200 mg/dL Final   HDL 07/21/2020 64  > OR = 50 mg/dL Final   Triglycerides 07/21/2020 60  <150 mg/dL Final   LDL Cholesterol (Calc) 07/21/2020 125 (A) mg/dL (calc) Final   Comment: Reference range: <100 . Desirable range <100 mg/dL for primary prevention;   <70 mg/dL for patients with CHD or diabetic patients  with > or = 2 CHD  risk factors. Marland Kitchen LDL-C is now calculated using the Martin-Hopkins  calculation, which is a validated novel method providing  better accuracy than the Friedewald equation in the  estimation of LDL-C.  Cresenciano Genre et al. Annamaria Helling. 3300;762(26): 2061-2068  (http://education.[facility].com/faq/FAQ164)    Total CHOL/HDL Ratio 07/21/2020 3.2  <5.0 (calc) Final   Non-HDL Cholesterol (Calc) 07/21/2020 140 (A) <130 mg/dL (calc) Final   Comment: For patients with diabetes plus 1 major ASCVD risk  factor, treating to a non-HDL-C goal of <100 mg/dL  (LDL-C of <70 mg/dL) is considered a therapeutic  option.    Glucose, Bld 07/21/2020 112 (A) 65 - 99 mg/dL Final   Comment: .            Fasting reference interval . For someone without known diabetes, a glucose value between 100 and 125 mg/dL is consistent with prediabetes and should be confirmed with a follow-up test. .    BUN 07/21/2020 13  7 - 25 mg/dL Final   Creat 07/21/2020 0.89  0.50 - 1.05 mg/dL Final   Comment: For patients >80 years of age, the reference limit for Creatinine is approximately 13% higher for people identified as African-American. .    GFR, Est Non African American 07/21/2020 71  > OR = 60 mL/min/1.14m Final   GFR, Est African American 07/21/2020 82  > OR = 60 mL/min/1.744mFinal   BUN/Creatinine  Ratio 40/98/1191 NOT APPLICABLE  6 - 22 (calc) Final   Sodium 07/21/2020 138  135 - 146 mmol/L Final   Potassium 07/21/2020 4.8  3.5 - 5.3 mmol/L Final   Chloride 07/21/2020 104  98 - 110 mmol/L Final   CO2 07/21/2020 27  20 - 32 mmol/L Final   Calcium 07/21/2020 9.3  8.6 - 10.4 mg/dL Final   Total Protein 07/21/2020 7.1  6.1 - 8.1 g/dL Final   Albumin 07/21/2020 4.2  3.6 - 5.1 g/dL Final   Globulin 07/21/2020 2.9  1.9 - 3.7 g/dL (calc) Final   AG Ratio 07/21/2020 1.4  1.0 - 2.5 (calc) Final   Total Bilirubin 07/21/2020 0.5  0.2 - 1.2 mg/dL Final   Alkaline phosphatase (APISO) 07/21/2020 63  37 - 153 U/L Final   AST  07/21/2020 24  10 - 35 U/L Final   ALT 07/21/2020 34 (A) 6 - 29 U/L Final   TSH 07/21/2020 1.89  0.40 - 4.50 mIU/L Final   WBC 07/21/2020 6.0  3.8 - 10.8 Thousand/uL Final   RBC 07/21/2020 4.62  3.80 - 5.10 Million/uL Final   Hemoglobin 07/21/2020 13.8  11.7 - 15.5 g/dL Final   HCT 07/21/2020 42.1  35.0 - 45.0 % Final   MCV 07/21/2020 91.1  80.0 - 100.0 fL Final   MCH 07/21/2020 29.9  27.0 - 33.0 pg Final   MCHC 07/21/2020 32.8  32.0 - 36.0 g/dL Final   RDW 07/21/2020 13.0  11.0 - 15.0 % Final   Platelets 07/21/2020 194  140 - 400 Thousand/uL Final   MPV 07/21/2020 10.9  7.5 - 12.5 fL Final   Neutro Abs 07/21/2020 3,264  1,500 - 7,800 cells/uL Final   Lymphs Abs 07/21/2020 2,118  850 - 3,900 cells/uL Final   Absolute Monocytes 07/21/2020 468  200 - 950 cells/uL Final   Eosinophils Absolute 07/21/2020 108  15 - 500 cells/uL Final   Basophils Absolute 07/21/2020 42  0 - 200 cells/uL Final   Neutrophils Relative % 07/21/2020 54.4  % Final   Total Lymphocyte 07/21/2020 35.3  % Final   Monocytes Relative 07/21/2020 7.8  % Final   Eosinophils Relative 07/21/2020 1.8  % Final   Basophils Relative 07/21/2020 0.7  % Final    Immunization History  Administered Date(s) Administered   Hepatitis B 12/02/2005, 01/03/2006, 03/28/2006   IPV 03/10/1999   Influenza-Unspecified 11/09/2015   MMR 07/28/2004   Td 03/10/1999   Tdap 09/05/2014    Past Medical History:  Diagnosis Date   Allergy    Medical history non-contributory    Varicose veins    Past Surgical History:  Procedure Laterality Date   ATHERECTOMY  1988   had DVT lt leg during pregnancy-clot removed in [place]   LASER ABLATION Left    ORIF RADIAL FRACTURE Left 07/27/2012   Procedure: OPEN REDUCTION INTERNAL FIXATION (ORIF) RADIAL FRACTURE;  Surgeon: Cammie Sickle., MD;  Location: [facility];  Service: Orthopedics;  Laterality: Left;   TUBAL LIGATION     Current Outpatient Medications on File Prior to Visit   Medication Sig Dispense Refill   cholecalciferol (VITAMIN D3) 25 MCG (1000 UNIT) tablet Take 1,000 Units by mouth daily.     Multiple Vitamin (MULTIVITAMIN WITH MINERALS) TABS tablet Take 1 tablet by mouth daily.     Omega-3 Fatty Acids (FISH OIL) 1000 MG CAPS Fish Oil     No current facility-administered medications on file prior to visit.   No Known  Allergies Social History   Socioeconomic History   Marital status: Married    Spouse name: Not on file   Number of children: Not on file   Years of education: Not on file   Highest education level: Not on file  Occupational History   Not on file  Tobacco Use   Smoking status: Former    Pack years: 0.00    Types: Cigarettes    Quit date: 02/09/1980    Years since quitting: 40.4   Smokeless tobacco: Never  Vaping Use   Vaping Use: Never used  Substance and Sexual Activity   Alcohol use: Yes    Alcohol/week: 0.0 standard drinks    Comment: occ   Drug use: No   Sexual activity: Yes    Comment: married, 3 sons.  Other Topics Concern   Not on file  Social History Narrative   Not on file   Social Determinants of Health   Financial Resource Strain: Not on file  Food Insecurity: Not on file  Transportation Needs: Not on file  Physical Activity: Not on file  Stress: Not on file  Social Connections: Not on file  Intimate Partner Violence: Not on file   Family History  Problem Relation Age of Onset   Diabetes Mother    Glaucoma Mother    Hypertension Mother    Diabetes Father    Heart disease Brother 70   Diabetes Brother      Review of Systems     Objective:   Physical Exam Vitals reviewed.  Constitutional:      General: She is not in acute distress.    Appearance: Normal appearance. She is obese. She is not ill-appearing, toxic-appearing or diaphoretic.  HENT:     Head: Normocephalic and atraumatic.     Right Ear: Tympanic membrane and ear canal normal.     Left Ear: Tympanic membrane and ear canal normal.      Nose: Nose normal. No congestion.     Mouth/Throat:     Mouth: Mucous membranes are moist.     Pharynx: Oropharynx is clear. No oropharyngeal exudate or posterior oropharyngeal erythema.  Eyes:     General:        Right eye: No discharge.        Left eye: No discharge.     Extraocular Movements: Extraocular movements intact.     Conjunctiva/sclera: Conjunctivae normal.     Pupils: Pupils are equal, round, and reactive to light.  Neck:     Vascular: No carotid bruit.  Cardiovascular:     Rate and Rhythm: Normal rate and regular rhythm.     Heart sounds: Normal heart sounds. No murmur heard.   No friction rub. No gallop.  Pulmonary:     Effort: Pulmonary effort is normal. No respiratory distress.     Breath sounds: Normal breath sounds. No wheezing, rhonchi or rales.  Abdominal:     General: Abdomen is flat. Bowel sounds are normal. There is no distension.     Palpations: Abdomen is soft. There is no mass.     Tenderness: There is no abdominal tenderness. There is no right CVA tenderness, left CVA tenderness, guarding or rebound.     Hernia: No hernia is present.  Musculoskeletal:        General: Tenderness and signs of injury present. No swelling or deformity.     Cervical back: Normal range of motion and neck supple.     Right lower leg: No edema.  Left lower leg: No edema.  Lymphadenopathy:     Cervical: No cervical adenopathy.  Skin:    General: Skin is warm.     Coloration: Skin is not jaundiced or pale.     Findings: No bruising, erythema, lesion or rash.  Neurological:     General: No focal deficit present.     Mental Status: She is alert and oriented to person, place, and time. Mental status is at baseline.     Cranial Nerves: No cranial nerve deficit.     Sensory: No sensory deficit.     Motor: No weakness.     Coordination: Coordination normal.     Gait: Gait normal.     Deep Tendon Reflexes: Reflexes normal.  Psychiatric:        Mood and Affect: Mood  normal.        Behavior: Behavior normal.        Thought Content: Thought content normal.        Judgment: Judgment normal.          Assessment & Plan:  Colon cancer screening - Plan: Ambulatory referral to Gastroenterology  Routine general medical examination at a health care facility  Mixed hyperlipidemia  Obesity with body mass index 30 or greater  Prediabetes Schedule the patient for colonoscopy.  She gets her mammogram and Pap smear at her gynecologist.  Recommended a 1500-calorie a day diet.  Recommended low-carb diet.  Recommended aerobic exercise such as swimming, stationary bike, or rowing machine.  In addition I recommended weight loss medication such as a GLP-1 agonist either Wegovy, Ozempic, or Trulicity.  The patient will check with her insurance to see if these are covered because I believe that with significant weight loss, we could reverse her prediabetes, fatty liver disease and may even help with her knee pain.

## 2020-07-28 ENCOUNTER — Telehealth: Payer: Self-pay | Admitting: *Deleted

## 2020-07-28 NOTE — Telephone Encounter (Signed)
Received request from pharmacy for [place] on Wegovy.  PA submitted.   Dx: E66.09- obesity.    Weight Height BMI  07/25/2020 217 lbs 64" 37.23%   [facility] is reviewing your PA request. Typically an electronic response will be received within 24-72 hours.

## 2020-07-29 ENCOUNTER — Telehealth: Payer: Self-pay | Admitting: Family Medicine

## 2020-07-29 NOTE — Telephone Encounter (Signed)
No action, closing encounter 

## 2020-07-29 NOTE — Telephone Encounter (Signed)
Received call from [place] at [facility] to give decision about requested prior auth for Semaglutide-Weight Management [facility]) 0.5 MG/0.5ML Darden Palmer [[id number]]  Prior Josem Kaufmann has been denied. [place] requested that this office call the patient to notify her.  Alvera Novel will also send a fax of the prior auth decision.

## 2020-07-30 ENCOUNTER — Encounter: Payer: Self-pay | Admitting: *Deleted

## 2020-07-30 NOTE — Telephone Encounter (Signed)
Received faxed determination.   PA denied.  The request for coverage for Wegovy Inj 0.5mg , use as directed (2 per month), is denied. This decision is based on health plan criteria for [facility]. This medicine is covered only if: All of the following: (1) You have failed to lose greater than or equal to 5% of body weight through at least 6 months of lifestyle modification alone (for example, dietary or caloric restriction, exercise, behavioral support, community-based program). Lifestyle modifications employed and total weight lost must be provided. (2) One of the following: (A) You have failed to lose greater than or equal to 5% of body weight after 6 months of treatment with over-the-counter orlistat (Alli). Date of trial of orlistat and total body weight lost must be provided. (B) You have a contraindication (including age) or intolerance to over-the-counter orlistat (Alli). (3) You have a contraindication, intolerance or failed to lose and maintain greater than or equal to 5% body weight following a 3 month trial each, of 2 of the following medications (date of trial of each medication and total body weight lost must be provided): (A) Xenical* (B) Qsymia* (C) Contrave*

## 2020-07-31 MED ORDER — METFORMIN HCL ER 500 MG PO TB24: 500.0000 mg | ORAL_TABLET | Freq: Every day | ORAL | 3 refills | Status: DC

## 2020-07-31 NOTE — Addendum Note (Signed)
Addended by: Sheral Flow on: 07/31/2020 02:03 PM   Modules accepted: Orders

## 2021-02-13 ENCOUNTER — Encounter: Payer: Self-pay | Admitting: Internal Medicine

## 2021-03-20 ENCOUNTER — Ambulatory Visit (AMBULATORY_SURGERY_CENTER): Payer: 59 | Admitting: *Deleted

## 2021-03-20 ENCOUNTER — Other Ambulatory Visit: Payer: Self-pay

## 2021-03-20 VITALS — Ht 64.0 in | Wt 205.0 lb

## 2021-03-20 DIAGNOSIS — Z1211 Encounter for screening for malignant neoplasm of colon: Secondary | ICD-10-CM

## 2021-03-20 MED ORDER — NA SULFATE-K SULFATE-MG SULF 17.5-3.13-1.6 GM/177ML PO SOLN: 1.0000 | Freq: Once | ORAL | 0 refills | Status: AC

## 2021-03-20 NOTE — Progress Notes (Signed)

## 2021-04-01 ENCOUNTER — Encounter: Payer: Self-pay | Admitting: Internal Medicine

## 2021-04-03 ENCOUNTER — Ambulatory Visit (AMBULATORY_SURGERY_CENTER): Payer: 59 | Admitting: Internal Medicine

## 2021-04-03 ENCOUNTER — Encounter: Payer: Self-pay | Admitting: Internal Medicine

## 2021-04-03 ENCOUNTER — Other Ambulatory Visit: Payer: Self-pay

## 2021-04-03 VITALS — BP 130/82 | HR 61 | Temp 98.0°F | Resp 11 | Ht 64.0 in | Wt 205.0 lb

## 2021-04-03 DIAGNOSIS — Z1211 Encounter for screening for malignant neoplasm of colon: Secondary | ICD-10-CM | POA: Diagnosis not present

## 2021-04-03 DIAGNOSIS — D124 Benign neoplasm of descending colon: Secondary | ICD-10-CM

## 2021-04-03 DIAGNOSIS — D123 Benign neoplasm of transverse colon: Secondary | ICD-10-CM | POA: Diagnosis not present

## 2021-04-03 DIAGNOSIS — D122 Benign neoplasm of ascending colon: Secondary | ICD-10-CM

## 2021-04-03 DIAGNOSIS — D120 Benign neoplasm of cecum: Secondary | ICD-10-CM

## 2021-04-03 MED ORDER — SODIUM CHLORIDE 0.9 % IV SOLN: 500.0000 mL | INTRAVENOUS | Status: DC

## 2021-04-03 NOTE — Op Note (Signed)
[facility] Patient Name: Shannon Fitzgerald Procedure Date: 04/03/2021 9:00 AM MRN: [id number] Endoscopist: Sonny Masters "Shannon Fitzgerald ,  Age: 61 Referring MD:  Date of Birth: 1960-12-21 Gender: Female Account #: [id number] Procedure:                Colonoscopy Indications:              Screening for colorectal malignant neoplasm Medicines:                Monitored Anesthesia Care Procedure:                Pre-Anesthesia Assessment:                           - Prior to the procedure, a History and Physical                            was performed, and patient medications and                            allergies were reviewed. The patient's tolerance of                            previous anesthesia was also reviewed. The risks                            and benefits of the procedure and the sedation                            options and risks were discussed with the patient.                            All questions were answered, and informed consent                            was obtained. Prior Anticoagulants: The patient has                            taken no previous anticoagulant or antiplatelet                            agents. ASA Grade Assessment: II - A patient with                            mild systemic disease. After reviewing the risks                            and benefits, the patient was deemed in                            satisfactory condition to undergo the procedure.                           After obtaining informed consent, the colonoscope  was passed under direct vision. Throughout the                            procedure, the patient's blood pressure, pulse, and                            oxygen saturations were monitored continuously. The                            PCF-HQ190L Colonoscope was introduced through the                            anus and advanced to the the terminal ileum. The                            colonoscopy  was performed without difficulty. The                            patient tolerated the procedure well. Scope In: 9:05:00 AM Scope Out: 9:41:25 AM Scope Withdrawal Time: 0 hours 30 minutes 22 seconds  Total Procedure Duration: 0 hours 36 minutes 25 seconds  Findings:                 The terminal ileum appeared normal.                           Three sessile polyps were found in the transverse                            colon, ascending colon and cecum. The polyps were 2                            to 5 mm in size. These polyps were removed with a                            cold snare. Resection and retrieval were complete.                           A 12 mm polyp was found in the transverse colon.                            The polyp was sessile. The polyp was removed with a                            piecemeal technique using a cold snare. Resection                            and retrieval were complete. Area was tattooed with                            an injection of 0.5 mL of Spot (carbon black).  A 3 mm polyp was found in the descending colon. The                            polyp was sessile. The polyp was removed with a                            cold snare. Resection and retrieval were complete.                           A few small-mouthed diverticula were found in the                            sigmoid colon.                           Non-bleeding internal hemorrhoids were found during                            retroflexion. Complications:            No immediate complications. Estimated Blood Loss:     Estimated blood loss was minimal. Impression:               - The examined portion of the ileum was normal.                           - Three 2 to 5 mm polyps in the transverse colon,                            in the ascending colon and in the cecum, removed                            with a cold snare. Resected and retrieved.                           -  One 12 mm polyp in the transverse colon, removed                            piecemeal using a cold snare. Resected and                            retrieved. Tattooed.                           - One 3 mm polyp in the descending colon, removed                            with a cold snare. Resected and retrieved.                           - Diverticulosis in the sigmoid colon.                           - Non-bleeding internal hemorrhoids. Recommendation:           -  Discharge patient to home (with escort).                           - Await pathology results.                           - The findings and recommendations were discussed                            with the patient. Sonny Masters "Shannon Fitzgerald,  04/03/2021 9:50:36 AM

## 2021-04-03 NOTE — Progress Notes (Signed)
To pacu, VSS. Report to [email or web address].tb 

## 2021-04-03 NOTE — Progress Notes (Signed)
Called to room to assist during endoscopic procedure.  Patient ID and intended procedure confirmed with present staff. Received instructions for my participation in the procedure from the performing physician.  

## 2021-04-03 NOTE — Patient Instructions (Signed)
Resume previous diet and medications. °Awaiting pathology results. Repeat Colonoscopy date to be determined based on pathology results. ° °YOU HAD AN ENDOSCOPIC PROCEDURE TODAY AT THE [facility]:   Refer to the procedure report that was given to you for any specific questions about what was found during the examination.  If the procedure report does not answer your questions, please call your gastroenterologist to clarify.  If you requested that your care partner not be given the details of your procedure findings, then the procedure report has been included in a sealed envelope for you to review at your convenience later. ° °YOU SHOULD EXPECT: Some feelings of bloating in the abdomen. Passage of more gas than usual.  Walking can help get rid of the air that was put into your GI tract during the procedure and reduce the bloating. If you had a lower endoscopy (such as a colonoscopy or flexible sigmoidoscopy) you may notice spotting of blood in your stool or on the toilet paper. If you underwent a bowel prep for your procedure, you may not have a normal bowel movement for a few days. ° °Please Note:  You might notice some irritation and congestion in your nose or some drainage.  This is from the oxygen used during your procedure.  There is no need for concern and it should clear up in a day or so. ° °SYMPTOMS TO REPORT IMMEDIATELY: ° °Following lower endoscopy (colonoscopy or flexible sigmoidoscopy): ° Excessive amounts of blood in the stool ° Significant tenderness or worsening of abdominal pains ° Swelling of the abdomen that is new, acute ° Fever of 100°F or higher ° °For urgent or emergent issues, a gastroenterologist can be reached at any hour by calling [phone]. °Do not use MyChart messaging for urgent concerns.  ° ° °DIET:  We do recommend a small meal at first, but then you may proceed to your regular diet.  Drink plenty of fluids but you should avoid alcoholic beverages for 24  hours. ° °ACTIVITY:  You should plan to take it easy for the rest of today and you should NOT DRIVE or use heavy machinery until tomorrow (because of the sedation medicines used during the test).   ° °FOLLOW UP: °Our staff will call the number listed on your records 48-72 hours following your procedure to check on you and address any questions or concerns that you may have regarding the information given to you following your procedure. If we do not reach you, we will leave a message.  We will attempt to reach you two times.  During this call, we will ask if you have developed any symptoms of COVID 19. If you develop any symptoms (ie: fever, flu-like symptoms, shortness of breath, cough etc.) before then, please call [phone].  If you test positive for Covid 19 in the 2 weeks post procedure, please call and report this information to us.   ° °If any biopsies were taken you will be contacted by phone or by letter within the next 1-3 weeks.  Please call us at [phone] if you have not heard about the biopsies in 3 weeks.  ° ° °SIGNATURES/CONFIDENTIALITY: °You and/or your care partner have signed paperwork which will be entered into your electronic medical record.  These signatures attest to the fact that that the information above on your After Visit Summary has been reviewed and is understood.  Full responsibility of the confidentiality of this discharge information lies with you and/or your care-partner.  °

## 2021-04-03 NOTE — Progress Notes (Signed)
GASTROENTEROLOGY PROCEDURE H&P NOTE   Primary Care Physician: Susy Frizzle, MD    Reason for Procedure:   Colon cancer screening  Plan:    Colonoscopy  Patient is appropriate for endoscopic procedure(s) in the ambulatory ([place]) setting.  The nature of the procedure, as well as the risks, benefits, and alternatives were carefully and thoroughly reviewed with the patient. Ample time for discussion and questions allowed. The patient understood, was satisfied, and agreed to proceed.     HPI: Shannon Fitzgerald is a 61 y.o. female who presents for colonoscopy for colon cancer screening. Denies blood in stools, changes in bowel habits, weight loss. Denies fam hx of colon cancer. Last colonoscopy was 10 years ago that was normal.  Past Medical History:  Diagnosis Date   Allergy    Anemia    "as a child"   Arthritis    right knee   Blood transfusion without reported diagnosis    " as a child"   Clotting disorder ([place])    DVT 1988   Depression    Diabetes mellitus without complication ([place])    PREDIABETIC   Medical history non-contributory    Varicose veins     Past Surgical History:  Procedure Laterality Date   ATHERECTOMY  02/08/1986   had DVT lt leg during pregnancy-clot removed in [place]   COLONOSCOPY     LASER ABLATION Left    ORIF RADIAL FRACTURE Left 07/27/2012   Procedure: OPEN REDUCTION INTERNAL FIXATION (ORIF) RADIAL FRACTURE;  Surgeon: Cammie Sickle., MD;  Location: [facility];  Service: Orthopedics;  Laterality: Left;   TUBAL LIGATION      Prior to Admission medications   Medication Sig Start Date End Date Taking? Authorizing Provider  cholecalciferol (VITAMIN D3) 25 MCG (1000 UNIT) tablet Take 1,000 Units by mouth daily.   Yes [provider]  Cyanocobalamin (VITAMIN B12 PO) Take by mouth daily. TAKING 5000 MCG DAILY   Yes [provider]  GLUCOSAMINE-CHONDROITIN PO Take 2,000 mg by mouth daily. TAKE 3 CAPSULES  DAILY   Yes [provider]  metFORMIN (GLUCOPHAGE XR) 500 MG 24 hr tablet Take 1 tablet (500 mg total) by mouth daily with breakfast. 07/31/20  Yes Susy Frizzle, MD  Multiple Vitamin (MULTIVITAMIN WITH MINERALS) TABS tablet Take 1 tablet by mouth daily.   Yes [provider]  Omega-3 Fatty Acids (FISH OIL) 1000 MG CAPS daily.   Yes [provider]  cetirizine (ZYRTEC) 10 MG tablet Take 10 mg by mouth as needed.    [provider]  meloxicam (MOBIC) 15 MG tablet Take 1 tablet (15 mg total) by mouth daily. 07/25/20   Susy Frizzle, MD    Current Outpatient Medications  Medication Sig Dispense Refill   cholecalciferol (VITAMIN D3) 25 MCG (1000 UNIT) tablet Take 1,000 Units by mouth daily.     Cyanocobalamin (VITAMIN B12 PO) Take by mouth daily. TAKING 5000 MCG DAILY     GLUCOSAMINE-CHONDROITIN PO Take 2,000 mg by mouth daily. TAKE 3 CAPSULES DAILY     metFORMIN (GLUCOPHAGE XR) 500 MG 24 hr tablet Take 1 tablet (500 mg total) by mouth daily with breakfast. 90 tablet 3   Multiple Vitamin (MULTIVITAMIN WITH MINERALS) TABS tablet Take 1 tablet by mouth daily.     Omega-3 Fatty Acids (FISH OIL) 1000 MG CAPS daily.     cetirizine (ZYRTEC) 10 MG tablet Take 10 mg by mouth as needed.     meloxicam (MOBIC)  15 MG tablet Take 1 tablet (15 mg total) by mouth daily. 30 tablet 0   Current Facility-Administered Medications  Medication Dose Route Frequency Provider Last Rate Last Admin   0.9 %  sodium chloride infusion  500 mL Intravenous Continuous Sharyn Creamer, MD        Allergies as of 04/03/2021   (No Known Allergies)    Family History  Problem Relation Age of Onset   Diabetes Mother    Glaucoma Mother    Hypertension Mother    Diabetes Father    Heart disease Brother 9   Diabetes Brother    Colon cancer Neg Hx    Colon polyps Neg Hx    Esophageal cancer Neg Hx    Rectal cancer Neg Hx    Stomach cancer Neg Hx     Social History    Socioeconomic History   Marital status: Married    Spouse name: Not on file   Number of children: Not on file   Years of education: Not on file   Highest education level: Not on file  Occupational History   Not on file  Tobacco Use   Smoking status: Former    Types: Cigarettes    Quit date: 02/09/1980    Years since quitting: 41.1    Passive exposure: Never   Smokeless tobacco: Never  Vaping Use   Vaping Use: Never used  Substance and Sexual Activity   Alcohol use: Yes    Alcohol/week: 0.0 standard drinks    Comment: occ   Drug use: No   Sexual activity: Yes    Comment: married, 3 sons.  Other Topics Concern   Not on file  Social History Narrative   Not on file   Social Determinants of Health   Financial Resource Strain: Not on file  Food Insecurity: Not on file  Transportation Needs: Not on file  Physical Activity: Not on file  Stress: Not on file  Social Connections: Not on file  Intimate Partner Violence: Not on file    Physical Exam: Vital signs in last 24 hours: BP 109/76    Pulse 74    Temp 98 F (36.7 C)    Ht 5\' 4"  (1.626 m)    Wt 205 lb (93 kg)    LMP 01/01/2013    SpO2 96%    BMI 35.19 kg/m  GEN: NAD EYE: Sclerae anicteric ENT: MMM CV: Non-tachycardic Pulm: No increased work of breathing GI: Soft, NT/ND NEURO:  Alert & Oriented   [name], MD [facility]  04/03/2021 8:56 AM

## 2021-04-07 ENCOUNTER — Encounter: Payer: Self-pay | Admitting: Family Medicine

## 2021-04-07 ENCOUNTER — Telehealth: Payer: Self-pay | Admitting: *Deleted

## 2021-04-07 ENCOUNTER — Other Ambulatory Visit: Payer: Self-pay

## 2021-04-07 ENCOUNTER — Ambulatory Visit (INDEPENDENT_AMBULATORY_CARE_PROVIDER_SITE_OTHER): Payer: 59 | Admitting: Family Medicine

## 2021-04-07 ENCOUNTER — Telehealth: Payer: Self-pay

## 2021-04-07 ENCOUNTER — Encounter: Payer: Self-pay | Admitting: Internal Medicine

## 2021-04-07 VITALS — BP 142/62 | HR 85 | Temp 98.1°F | Resp 18 | Ht 64.0 in | Wt 205.0 lb

## 2021-04-07 DIAGNOSIS — S83241A Other tear of medial meniscus, current injury, right knee, initial encounter: Secondary | ICD-10-CM | POA: Diagnosis not present

## 2021-04-07 MED ORDER — DICLOFENAC SODIUM 75 MG PO TBEC: 75.0000 mg | DELAYED_RELEASE_TABLET | Freq: Two times a day (BID) | ORAL | 0 refills | Status: DC

## 2021-04-07 NOTE — Telephone Encounter (Signed)
°  Follow up Call-  Call back number 04/03/2021  Post procedure Call Back phone  # [phone]  Permission to leave phone message Yes  Some recent data might be hidden     Patient questions:  Do you have a fever, pain , or abdominal swelling? No. Pain Score  0 *  Have you tolerated food without any problems? Yes.    Have you been able to return to your normal activities? Yes.    Do you have any questions about your discharge instructions: Diet   No. Medications  No. Follow up visit  No.  Do you have questions or concerns about your Care? No.  Actions: * If pain score is 4 or above: No action needed, pain <4.

## 2021-04-07 NOTE — Progress Notes (Signed)
Subjective:    Patient ID: Shannon Fitzgerald, female    DOB: Feb 05, 1961, 61 y.o.   MRN: [id number]  Knee Pain   Patient is a very pleasant 61 year old Caucasian female who presents today with worsening right knee pain.  She saw orthopedics last year who did an x-ray and told her that she had a arthritis.  However she states the pain began suddenly after she suffered a twisting injury to the knee.  She states that she feels the knee grinding and popping constantly.  She reports some feeling of instability like the knee may give way on her.  She also reports significant pain with Apley grind.  Pain is located over the medial compartment.  She tried meloxicam for 30 days with no improvement.  She has had an x-ray through an orthopedist office that showed "arthritis".  However she states the pain is becoming severe.  She is walking with a limp.  She is been putting up with this for more than 6 months with no improvement. Past Medical History:  Diagnosis Date   Allergy    Anemia    "as a child"   Arthritis    right knee   Blood transfusion without reported diagnosis    " as a child"   Clotting disorder ([place])    DVT 1988   Depression    Diabetes mellitus without complication ([place])    PREDIABETIC   Medical history non-contributory    Varicose veins    Past Surgical History:  Procedure Laterality Date   ATHERECTOMY  02/08/1986   had DVT lt leg during pregnancy-clot removed in [place]   COLONOSCOPY     LASER ABLATION Left    ORIF RADIAL FRACTURE Left 07/27/2012   Procedure: OPEN REDUCTION INTERNAL FIXATION (ORIF) RADIAL FRACTURE;  Surgeon: Cammie Sickle., MD;  Location: [facility];  Service: Orthopedics;  Laterality: Left;   TUBAL LIGATION     Current Outpatient Medications on File Prior to Visit  Medication Sig Dispense Refill   cetirizine (ZYRTEC) 10 MG tablet Take 10 mg by mouth as needed.     cholecalciferol (VITAMIN D3) 25 MCG (1000 UNIT) tablet Take 1,000 Units  by mouth daily.     Cyanocobalamin (VITAMIN B12 PO) Take by mouth daily. TAKING 5000 MCG DAILY     GLUCOSAMINE-CHONDROITIN PO Take 2,000 mg by mouth daily. TAKE 3 CAPSULES DAILY     meloxicam (MOBIC) 15 MG tablet Take 1 tablet (15 mg total) by mouth daily. 30 tablet 0   metFORMIN (GLUCOPHAGE XR) 500 MG 24 hr tablet Take 1 tablet (500 mg total) by mouth daily with breakfast. 90 tablet 3   Multiple Vitamin (MULTIVITAMIN WITH MINERALS) TABS tablet Take 1 tablet by mouth daily.     Omega-3 Fatty Acids (FISH OIL) 1000 MG CAPS daily.     No current facility-administered medications on file prior to visit.   No Known Allergies Social History   Socioeconomic History   Marital status: Married    Spouse name: Not on file   Number of children: Not on file   Years of education: Not on file   Highest education level: Not on file  Occupational History   Not on file  Tobacco Use   Smoking status: Former    Types: Cigarettes    Quit date: 02/09/1980    Years since quitting: 41.1    Passive exposure: Never   Smokeless tobacco: Never  Vaping Use   Vaping Use: Never used  Substance and Sexual Activity   Alcohol use: Yes    Alcohol/week: 0.0 standard drinks    Comment: occ   Drug use: No   Sexual activity: Yes    Comment: married, 3 sons.  Other Topics Concern   Not on file  Social History Narrative   Not on file   Social Determinants of Health   Financial Resource Strain: Not on file  Food Insecurity: Not on file  Transportation Needs: Not on file  Physical Activity: Not on file  Stress: Not on file  Social Connections: Not on file  Intimate Partner Violence: Not on file      Review of Systems  All other systems reviewed and are negative.     Objective:   Physical Exam Constitutional:      Appearance: Normal appearance. She is obese.  Cardiovascular:     Rate and Rhythm: Normal rate and regular rhythm.     Heart sounds: Normal heart sounds.  Pulmonary:     Effort:  Pulmonary effort is normal.     Breath sounds: Normal breath sounds.  Musculoskeletal:     Right knee: Bony tenderness and crepitus present. No swelling, effusion, erythema or ecchymosis. Decreased range of motion. No LCL laxity, MCL laxity, ACL laxity or PCL laxity. Abnormal meniscus.     Instability Tests: Anterior drawer test negative. Posterior drawer test negative.  Neurological:     Mental Status: She is alert.          Assessment & Plan:  Tear of medial meniscus of right knee, unspecified tear type, unspecified whether old or current tear, initial encounter Based on her x-ray and her history, I suspect that she has osteoarthritis.  However, her exam and her history also suggest that she may have suffered a meniscal tear.  She has tried and failed 6 months of conservative therapy including NSAIDs and rest.  Therefore I recommended an MRI to see if there is in fact a meniscal tear he would benefit from arthroscopic surgery.  Meanwhile, we will switch NSAIDs to diclofenac 75 mg twice daily.

## 2021-04-07 NOTE — Telephone Encounter (Signed)
°  Follow up Call-  Call back number 04/03/2021  Post procedure Call Back phone  # [phone]  Permission to leave phone message Yes  Some recent data might be hidden   Follow up call, LVM

## 2021-04-15 ENCOUNTER — Telehealth: Payer: Self-pay

## 2021-04-15 NOTE — Telephone Encounter (Signed)
Per [facility] MRI of knee was deemed not medically necessary and has been DENIED. ? ?FYI, thanks! ?

## 2021-04-22 ENCOUNTER — Other Ambulatory Visit: Payer: 59

## 2021-04-30 ENCOUNTER — Telehealth: Payer: Self-pay

## 2021-04-30 NOTE — Telephone Encounter (Signed)
Patient has not been able to use her current gym membership since this past August due to her knee pain. She would like to know if you can provide a letter stating she is currently unable to use her membership to [employer] due to her pain. ? ?Please advise, thanks! ?

## 2021-05-01 ENCOUNTER — Encounter: Payer: Self-pay | Admitting: Family Medicine

## 2021-05-01 NOTE — Telephone Encounter (Signed)
Patient is aware. Nothing further needed at this time.  ? ?

## 2021-05-11 ENCOUNTER — Other Ambulatory Visit: Payer: Self-pay | Admitting: Family Medicine

## 2021-08-07 ENCOUNTER — Encounter: Payer: Self-pay | Admitting: Family Medicine

## 2021-08-07 MED ORDER — METFORMIN HCL ER 500 MG PO TB24: 500.0000 mg | ORAL_TABLET | Freq: Every day | ORAL | 3 refills | Status: DC

## 2021-10-30 ENCOUNTER — Other Ambulatory Visit: Payer: Self-pay

## 2021-10-30 ENCOUNTER — Telehealth: Payer: Self-pay

## 2021-10-30 DIAGNOSIS — R7303 Prediabetes: Secondary | ICD-10-CM

## 2021-10-30 MED ORDER — METFORMIN HCL ER 500 MG PO TB24: 500.0000 mg | ORAL_TABLET | Freq: Every day | ORAL | 3 refills | Status: AC

## 2021-10-30 NOTE — Telephone Encounter (Signed)
Pt called in to schedule cpe but realized that she will be out of this med before this appt  metFORMIN (GLUCOPHAGE XR) 500 MG 24 hr tablet [[id number]]   Pt just wanted to know if she could get enough of a refill to last until her cpe appt please.  LOV: 2/828/23  PHARMACY: [facility] ON [address]  CPE SCHEDULED:12/11/21

## 2021-12-08 ENCOUNTER — Other Ambulatory Visit: Payer: 59

## 2021-12-08 DIAGNOSIS — E782 Mixed hyperlipidemia: Secondary | ICD-10-CM

## 2021-12-08 DIAGNOSIS — E669 Obesity, unspecified: Secondary | ICD-10-CM

## 2021-12-08 DIAGNOSIS — E559 Vitamin D deficiency, unspecified: Secondary | ICD-10-CM

## 2021-12-08 DIAGNOSIS — Z1329 Encounter for screening for other suspected endocrine disorder: Secondary | ICD-10-CM

## 2021-12-08 DIAGNOSIS — R7303 Prediabetes: Secondary | ICD-10-CM

## 2021-12-09 LAB — COMPLETE METABOLIC PANEL WITH GFR
AG Ratio: 1.4 (calc) (ref 1.0–2.5)
ALT: 40 U/L — ABNORMAL HIGH (ref 6–29)
AST: 25 U/L (ref 10–35)
Albumin: 4.2 g/dL (ref 3.6–5.1)
Alkaline phosphatase (APISO): 68 U/L (ref 37–153)
BUN: 12 mg/dL (ref 7–25)
CO2: 25 mmol/L (ref 20–32)
Calcium: 9.3 mg/dL (ref 8.6–10.4)
Chloride: 107 mmol/L (ref 98–110)
Creat: 0.85 mg/dL (ref 0.50–1.05)
Globulin: 2.9 g/dL (calc) (ref 1.9–3.7)
Glucose, Bld: 95 mg/dL (ref 65–99)
Potassium: 4.4 mmol/L (ref 3.5–5.3)
Sodium: 142 mmol/L (ref 135–146)
Total Bilirubin: 0.6 mg/dL (ref 0.2–1.2)
Total Protein: 7.1 g/dL (ref 6.1–8.1)
eGFR: 78 mL/min/{1.73_m2} (ref >=60)

## 2021-12-09 LAB — CBC WITH DIFFERENTIAL/PLATELET
Absolute Monocytes: 630 cells/uL (ref 200–950)
Basophils Absolute: 67 cells/uL (ref 0–200)
Basophils Relative: 0.8 %
Eosinophils Absolute: 580 cells/uL — ABNORMAL HIGH (ref 15–500)
Eosinophils Relative: 6.9 %
HCT: 40 % (ref 35.0–45.0)
Hemoglobin: 13.7 g/dL (ref 11.7–15.5)
Lymphs Abs: 3452 cells/uL (ref 850–3900)
MCH: 30.5 pg (ref 27.0–33.0)
MCHC: 34.3 g/dL (ref 32.0–36.0)
MCV: 89.1 fL (ref 80.0–100.0)
MPV: 11.2 fL (ref 7.5–12.5)
Monocytes Relative: 7.5 %
Neutro Abs: 3671 cells/uL (ref 1500–7800)
Neutrophils Relative %: 43.7 %
Platelets: 219 10*3/uL (ref 140–400)
RBC: 4.49 10*6/uL (ref 3.80–5.10)
RDW: 13.1 % (ref 11.0–15.0)
Total Lymphocyte: 41.1 %
WBC: 8.4 10*3/uL (ref 3.8–10.8)

## 2021-12-09 LAB — HEMOGLOBIN A1C
Hgb A1c MFr Bld: 6.1 % of total Hgb — ABNORMAL HIGH (ref <5.7)
Mean Plasma Glucose: 128 mg/dL
eAG (mmol/L): 7.1 mmol/L

## 2021-12-09 LAB — LIPID PANEL
Cholesterol: 224 mg/dL — ABNORMAL HIGH (ref <200)
HDL: 58 mg/dL (ref >=50)
LDL Cholesterol (Calc): 139 mg/dL (calc) — ABNORMAL HIGH
Non-HDL Cholesterol (Calc): 166 mg/dL (calc) — ABNORMAL HIGH (ref <130)
Total CHOL/HDL Ratio: 3.9 (calc) (ref <5.0)
Triglycerides: 148 mg/dL (ref <150)

## 2021-12-09 LAB — VITAMIN D 25 HYDROXY (VIT D DEFICIENCY, FRACTURES): Vit D, 25-Hydroxy: 27 ng/mL — ABNORMAL LOW (ref 30–100)

## 2021-12-09 LAB — TSH: TSH: 2.61 mIU/L (ref 0.40–4.50)

## 2021-12-11 ENCOUNTER — Ambulatory Visit (INDEPENDENT_AMBULATORY_CARE_PROVIDER_SITE_OTHER): Payer: 59 | Admitting: Family Medicine

## 2021-12-11 VITALS — BP 114/62 | HR 76 | Ht 64.0 in | Wt 196.4 lb

## 2021-12-11 DIAGNOSIS — E669 Obesity, unspecified: Secondary | ICD-10-CM

## 2021-12-11 DIAGNOSIS — Z23 Encounter for immunization: Secondary | ICD-10-CM | POA: Diagnosis not present

## 2021-12-11 DIAGNOSIS — Z0000 Encounter for general adult medical examination without abnormal findings: Secondary | ICD-10-CM | POA: Diagnosis not present

## 2021-12-11 DIAGNOSIS — E782 Mixed hyperlipidemia: Secondary | ICD-10-CM

## 2021-12-11 DIAGNOSIS — R7303 Prediabetes: Secondary | ICD-10-CM

## 2021-12-11 NOTE — Progress Notes (Signed)
Subjective:    Patient ID: Shannon Fitzgerald, female    DOB: 1960-06-29, 61 y.o.   MRN: [id number]  HPI Patient is here today for complete physical exam.  She had a colonoscopy earlier this year.  She had several polyps which were tubular adenoma so they recommended a repeat colonoscopy in 3 years.  She had her mammogram and her Pap smear this year through her gynecologist.  She is not yet due for a bone density test.  She is due for a flu shot, COVID booster, and a shingles vaccine.  Her most recent lab work is listed below Lab on 12/08/2021  Component Date Value Ref Range Status   Glucose, Bld 12/08/2021 95  65 - 99 mg/dL Final   Comment: .            Fasting reference interval .    BUN 12/08/2021 12  7 - 25 mg/dL Final   Creat 12/08/2021 0.85  0.50 - 1.05 mg/dL Final   eGFR 12/08/2021 78  > OR = 60 mL/min/1.76m Final   BUN/Creatinine Ratio 12/08/2021 SEE NOTE:  6 - 22 (calc) Final   Comment:    Not Reported: BUN and Creatinine are within    reference range. .    Sodium 12/08/2021 142  135 - 146 mmol/L Final   Potassium 12/08/2021 4.4  3.5 - 5.3 mmol/L Final   Chloride 12/08/2021 107  98 - 110 mmol/L Final   CO2 12/08/2021 25  20 - 32 mmol/L Final   Calcium 12/08/2021 9.3  8.6 - 10.4 mg/dL Final   Total Protein 12/08/2021 7.1  6.1 - 8.1 g/dL Final   Albumin 12/08/2021 4.2  3.6 - 5.1 g/dL Final   Globulin 12/08/2021 2.9  1.9 - 3.7 g/dL (calc) Final   AG Ratio 12/08/2021 1.4  1.0 - 2.5 (calc) Final   Total Bilirubin 12/08/2021 0.6  0.2 - 1.2 mg/dL Final   Alkaline phosphatase (APISO) 12/08/2021 68  37 - 153 U/L Final   AST 12/08/2021 25  10 - 35 U/L Final   ALT 12/08/2021 40 (H)  6 - 29 U/L Final   WBC 12/08/2021 8.4  3.8 - 10.8 Thousand/uL Final   RBC 12/08/2021 4.49  3.80 - 5.10 Million/uL Final   Hemoglobin 12/08/2021 13.7  11.7 - 15.5 g/dL Final   HCT 12/08/2021 40.0  35.0 - 45.0 % Final   MCV 12/08/2021 89.1  80.0 - 100.0 fL Final   MCH 12/08/2021 30.5  27.0 - 33.0 pg  Final   MCHC 12/08/2021 34.3  32.0 - 36.0 g/dL Final   RDW 12/08/2021 13.1  11.0 - 15.0 % Final   Platelets 12/08/2021 219  140 - 400 Thousand/uL Final   MPV 12/08/2021 11.2  7.5 - 12.5 fL Final   Neutro Abs 12/08/2021 3,671  1,500 - 7,800 cells/uL Final   Lymphs Abs 12/08/2021 3,452  850 - 3,900 cells/uL Final   Absolute Monocytes 12/08/2021 630  200 - 950 cells/uL Final   Eosinophils Absolute 12/08/2021 580 (H)  15 - 500 cells/uL Final   Basophils Absolute 12/08/2021 67  0 - 200 cells/uL Final   Neutrophils Relative % 12/08/2021 43.7  % Final   Total Lymphocyte 12/08/2021 41.1  % Final   Monocytes Relative 12/08/2021 7.5  % Final   Eosinophils Relative 12/08/2021 6.9  % Final   Basophils Relative 12/08/2021 0.8  % Final   Cholesterol 12/08/2021 224 (H)  <200 mg/dL Final   HDL 12/08/2021 58  > OR =  50 mg/dL Final   Triglycerides 12/08/2021 148  <150 mg/dL Final   LDL Cholesterol (Calc) 12/08/2021 139 (H)  mg/dL (calc) Final   Comment: Reference range: <100 . Desirable range <100 mg/dL for primary prevention;   <70 mg/dL for patients with CHD or diabetic patients  with > or = 2 CHD risk factors. Marland Kitchen LDL-C is now calculated using the Martin-Hopkins  calculation, which is a validated novel method providing  better accuracy than the Friedewald equation in the  estimation of LDL-C.  Cresenciano Genre et al. Annamaria Helling. 6010;932(35): 2061-2068  (http://education.[facility].com/faq/FAQ164)    Total CHOL/HDL Ratio 12/08/2021 3.9  <5.0 (calc) Final   Non-HDL Cholesterol (Calc) 12/08/2021 166 (H)  <130 mg/dL (calc) Final   Comment: For patients with diabetes plus 1 major ASCVD risk  factor, treating to a non-HDL-C goal of <100 mg/dL  (LDL-C of <70 mg/dL) is considered a therapeutic  option.    Hgb A1c MFr Bld 12/08/2021 6.1 (H)  <5.7 % of total Hgb Final   Comment: For someone without known diabetes, a hemoglobin  A1c value between 5.7% and 6.4% is consistent with prediabetes and should be  confirmed with a  follow-up test. . For someone with known diabetes, a value <7% indicates that their diabetes is well controlled. A1c targets should be individualized based on duration of diabetes, age, comorbid conditions, and other considerations. . This assay result is consistent with an increased risk of diabetes. . Currently, no consensus exists regarding use of hemoglobin A1c for diagnosis of diabetes for children. .    Mean Plasma Glucose 12/08/2021 128  mg/dL Final   eAG (mmol/L) 12/08/2021 7.1  mmol/L Final   TSH 12/08/2021 2.61  0.40 - 4.50 mIU/L Final   Vit D, 25-Hydroxy 12/08/2021 27 (L)  30 - 100 ng/mL Final   Comment: Vitamin D Status         25-OH Vitamin D: . Deficiency:                    <20 ng/mL Insufficiency:             20 - 29 ng/mL Optimal:                 > or = 30 ng/mL . For 25-OH Vitamin D testing on patients on  D2-supplementation and patients for whom quantitation  of D2 and D3 fractions is required, the [facility](TM) 25-OH VIT D, (D2,D3), LC/MS/MS is recommended: order  code [id number] (patients >22yr). . See Note 1 . Note 1 . For additional information, please refer to  http://education.[facility].com/faq/FAQ199  (This link is being provided for informational/ educational purposes only.)     Immunization History  Administered Date(s) Administered   Hepatitis B 12/02/2005, 01/03/2006, 03/28/2006   IPV 03/10/1999   Influenza-Unspecified 11/09/2014, 11/09/2015   MMR 07/28/2004   Td 03/10/1999   Tdap 09/05/2014    Past Medical History:  Diagnosis Date   Allergy    Anemia    "as a child"   Arthritis    right knee   Blood transfusion without reported diagnosis    " as a child"   [place]    [place] 1988   Depression    Diabetes [place]    PREDIABETIC   Medical history non-contributory    Varicose veins    Past Surgical History:  Procedure Laterality Date   ATHERECTOMY   02/08/1986   had DVT lt leg during pregnancy[place]  COLONOSCOPY     LASER ABLATION Left    ORIF RADIAL FRACTURE Left 07/27/2012   Procedure: OPEN REDUCTION INTERNAL FIXATION (ORIF) RADIAL FRACTURE;  Surgeon: Cammie Sickle., MD;  Location: [facility];  Service: Orthopedics;  Laterality: Left;   TUBAL LIGATION     Current Outpatient Medications on File Prior to Visit  Medication Sig Dispense Refill   cetirizine (ZYRTEC) 10 MG tablet Take 10 mg by mouth as needed.     Cyanocobalamin (VITAMIN B12 PO) Take by mouth daily. TAKING 5000 MCG DAILY     fluticasone (FLONASE) 50 MCG/ACT nasal spray Place into both nostrils daily.     GLUCOSAMINE-CHONDROITIN PO Take 2,000 mg by mouth daily. TAKE 3 CAPSULES DAILY     metFORMIN (GLUCOPHAGE XR) 500 MG 24 hr tablet Take 1 tablet (500 mg total) by mouth daily with breakfast. 90 tablet 3   Multiple Vitamin (MULTIVITAMIN WITH MINERALS) TABS tablet Take 1 tablet by mouth daily.     Omega-3 Fatty Acids (FISH OIL) 1000 MG CAPS daily.     No current facility-administered medications on file prior to visit.   No Known Allergies Social History   Socioeconomic History   Marital status: Married    Spouse name: Not on file   Number of children: Not on file   Years of education: Not on file   Highest education level: Not on file  Occupational History   Not on file  Tobacco Use   Smoking status: Former    Types: Cigarettes    Quit date: 02/09/1980    Years since quitting: 41.8    Passive exposure: Never   Smokeless tobacco: Never  Vaping Use   Vaping Use: Never used  Substance and Sexual Activity   Alcohol use: Yes    Alcohol/week: 0.0 standard drinks of alcohol    Comment: occ   Drug use: No   Sexual activity: Yes    Comment: married, 3 sons.  Other Topics Concern   Not on file  Social History Narrative   Not on file   Social Determinants of Health   Financial Resource Strain: Not on file  Food  Insecurity: Not on file  Transportation Needs: Not on file  Physical Activity: Not on file  Stress: Not on file  Social Connections: Not on file  Intimate Partner Violence: Not on file   Family History  Problem Relation Age of Onset   Diabetes Mother    Glaucoma Mother    Hypertension Mother    Diabetes Father    Heart disease Brother 84   Diabetes Brother    Colon cancer Neg Hx    Colon polyps Neg Hx    Esophageal cancer Neg Hx    Rectal cancer Neg Hx    Stomach cancer Neg Hx      Review of Systems     Objective:   Physical Exam Vitals reviewed.  Constitutional:      General: She is not in acute distress.    Appearance: Normal appearance. She is obese. She is not ill-appearing, toxic-appearing or diaphoretic.  HENT:     Head: Normocephalic and atraumatic.     Right Ear: Tympanic membrane and ear canal normal.     Left Ear: Tympanic membrane and ear canal normal.     Nose: Nose normal. No congestion.     Mouth/Throat:     Mouth: Mucous membranes are moist.     Pharynx: Oropharynx is clear. No oropharyngeal exudate or posterior  oropharyngeal erythema.  Eyes:     General:        Right eye: No discharge.        Left eye: No discharge.     Extraocular Movements: Extraocular movements intact.     Conjunctiva/sclera: Conjunctivae normal.     Pupils: Pupils are equal, round, and reactive to light.  Neck:     Vascular: No carotid bruit.  Cardiovascular:     Rate and Rhythm: Normal rate and regular rhythm.     Heart sounds: Normal heart sounds. No murmur heard.    No friction rub. No gallop.  Pulmonary:     Effort: Pulmonary effort is normal. No respiratory distress.     Breath sounds: Normal breath sounds. No wheezing, rhonchi or rales.  Abdominal:     General: Abdomen is flat. Bowel sounds are normal. There is no distension.     Palpations: Abdomen is soft. There is no mass.     Tenderness: There is no abdominal tenderness. There is no right CVA tenderness, left  CVA tenderness, guarding or rebound.     Hernia: No hernia is present.  Musculoskeletal:        General: Tenderness and signs of injury present. No swelling or deformity.     Cervical back: Normal range of motion and neck supple.     Right lower leg: No edema.     Left lower leg: No edema.  Lymphadenopathy:     Cervical: No cervical adenopathy.  Skin:    General: Skin is warm.     Coloration: Skin is not jaundiced or pale.     Findings: No bruising, erythema, lesion or rash.  Neurological:     General: No focal deficit present.     Mental Status: She is alert and oriented to person, place, and time. Mental status is at baseline.     Cranial Nerves: No cranial nerve deficit.     Sensory: No sensory deficit.     Motor: No weakness.     Coordination: Coordination normal.     Gait: Gait normal.     Deep Tendon Reflexes: Reflexes normal.  Psychiatric:        Mood and Affect: Mood normal.        Behavior: Behavior normal.        Thought Content: Thought content normal.        Judgment: Judgment normal.           Assessment & Plan:  Routine general medical examination at a health care facility  Prediabetes  Mixed hyperlipidemia  Obesity with body mass index 30 or greater Patient's insurance will not cover [place].  I recommended that she continue the metformin.  Her A1c is stable or slightly improved.  Her LDL cholesterol remains elevated and we recommended a low fat diet and she would like to work on this prior to starting a statin.  Also encouraged exercise as well as a 1500-calorie a day diet.  Recommended a flu shot which she received today.  Recommended the shingles vaccine and a COVID shot through her pharmacy.  Cancer screening is up-to-date.  Vitamin D is low.  Encouraged the patient to 1200 mg a day of calcium and 2000 units a day of vitamin D

## 2021-12-11 NOTE — Addendum Note (Signed)
Addended by: Randal Buba K on: 12/11/2021 10:13 AM   Modules accepted: Orders

## 2022-02-18 LAB — HM DIABETES EYE EXAM

## 2022-03-01 ENCOUNTER — Encounter: Payer: Self-pay | Admitting: Family Medicine

## 2022-03-16 DIAGNOSIS — R8769 Abnormal cytological findings in specimens from other female genital organs: Secondary | ICD-10-CM | POA: Insufficient documentation

## 2022-09-21 ENCOUNTER — Encounter: Payer: Self-pay | Admitting: Family Medicine

## 2022-09-27 ENCOUNTER — Other Ambulatory Visit: Payer: 59

## 2022-09-27 DIAGNOSIS — E559 Vitamin D deficiency, unspecified: Secondary | ICD-10-CM

## 2022-09-27 DIAGNOSIS — R7303 Prediabetes: Secondary | ICD-10-CM

## 2022-09-27 DIAGNOSIS — R5383 Other fatigue: Secondary | ICD-10-CM

## 2022-09-27 DIAGNOSIS — E669 Obesity, unspecified: Secondary | ICD-10-CM

## 2022-09-27 DIAGNOSIS — E782 Mixed hyperlipidemia: Secondary | ICD-10-CM

## 2022-09-28 ENCOUNTER — Ambulatory Visit (INDEPENDENT_AMBULATORY_CARE_PROVIDER_SITE_OTHER): Payer: 59 | Admitting: Family Medicine

## 2022-09-28 VITALS — BP 132/76 | HR 65 | Temp 98.2°F | Ht 64.0 in | Wt 203.0 lb

## 2022-09-28 DIAGNOSIS — E7800 Pure hypercholesterolemia, unspecified: Secondary | ICD-10-CM

## 2022-09-28 DIAGNOSIS — R7303 Prediabetes: Secondary | ICD-10-CM

## 2022-09-28 LAB — COMPLETE METABOLIC PANEL WITH GFR
AG Ratio: 1.4 (calc) (ref 1.0–2.5)
ALT: 26 U/L (ref 6–29)
AST: 20 U/L (ref 10–35)
Albumin: 4.5 g/dL (ref 3.6–5.1)
Alkaline phosphatase (APISO): 70 U/L (ref 37–153)
BUN: 19 mg/dL (ref 7–25)
CO2: 21 mmol/L (ref 20–32)
Calcium: 9.7 mg/dL (ref 8.6–10.4)
Chloride: 106 mmol/L (ref 98–110)
Creat: 0.96 mg/dL (ref 0.50–1.05)
Globulin: 3.2 g/dL (ref 1.9–3.7)
Glucose, Bld: 120 mg/dL — ABNORMAL HIGH (ref 65–99)
Potassium: 4.5 mmol/L (ref 3.5–5.3)
Sodium: 137 mmol/L (ref 135–146)
Total Bilirubin: 0.4 mg/dL (ref 0.2–1.2)
Total Protein: 7.7 g/dL (ref 6.1–8.1)
eGFR: 67 mL/min/{1.73_m2} (ref >=60)

## 2022-09-28 LAB — CBC WITH DIFFERENTIAL/PLATELET
Absolute Monocytes: 475 {cells}/uL (ref 200–950)
Basophils Absolute: 58 cells/uL (ref 0–200)
Basophils Relative: 0.8 %
Eosinophils Absolute: 252 {cells}/uL (ref 15–500)
Eosinophils Relative: 3.5 %
HCT: 42.4 % (ref 35.0–45.0)
Hemoglobin: 14.2 g/dL (ref 11.7–15.5)
Lymphs Abs: 3053 {cells}/uL (ref 850–3900)
MCH: 30 pg (ref 27.0–33.0)
MCHC: 33.5 g/dL (ref 32.0–36.0)
MCV: 89.6 fL (ref 80.0–100.0)
MPV: 10.9 fL (ref 7.5–12.5)
Monocytes Relative: 6.6 %
Neutro Abs: 3362 {cells}/uL (ref 1500–7800)
Neutrophils Relative %: 46.7 %
Platelets: 252 10*3/uL (ref 140–400)
RBC: 4.73 10*6/uL (ref 3.80–5.10)
RDW: 13.1 % (ref 11.0–15.0)
Total Lymphocyte: 42.4 %
WBC: 7.2 10*3/uL (ref 3.8–10.8)

## 2022-09-28 LAB — TSH: TSH: 4.86 m[IU]/L — ABNORMAL HIGH (ref 0.40–4.50)

## 2022-09-28 LAB — LIPID PANEL
Cholesterol: 237 mg/dL — ABNORMAL HIGH (ref <200)
HDL: 67 mg/dL (ref >=50)
LDL Cholesterol (Calc): 152 mg/dL — ABNORMAL HIGH
Non-HDL Cholesterol (Calc): 170 mg/dL — ABNORMAL HIGH (ref <130)
Total CHOL/HDL Ratio: 3.5 (calc) (ref <5.0)
Triglycerides: 81 mg/dL (ref <150)

## 2022-09-28 LAB — VITAMIN D 25 HYDROXY (VIT D DEFICIENCY, FRACTURES): Vit D, 25-Hydroxy: 29 ng/mL — ABNORMAL LOW (ref 30–100)

## 2022-09-28 LAB — HEMOGLOBIN A1C
Hgb A1c MFr Bld: 6.1 %{Hb} — ABNORMAL HIGH (ref <5.7)
Mean Plasma Glucose: 128 mg/dL
eAG (mmol/L): 7.1 mmol/L

## 2022-09-28 MED ORDER — SEMAGLUTIDE(0.25 OR 0.5MG/DOS) 2 MG/3ML ~~LOC~~ SOPN: 0.2500 mg | PEN_INJECTOR | SUBCUTANEOUS | 3 refills | Status: DC

## 2022-09-28 NOTE — Progress Notes (Signed)
Subjective:    Patient ID: Shannon Fitzgerald, female    DOB: 07-03-1960, 62 y.o.   MRN: [id number]  HPI  Patient is here today for a checkup.  She has a history of prediabetes.  Her A1c was 6.1.  She is been taking metformin consistently however her A1c remains elevated at 6.1.  She been trying diet and exercise but is failed to lose any weight.  Recently her cholesterol has risen 237 and her LDL cholesterol is up to 152.  This is despite taking fish oil.  Her HDL cholesterol is elevated at 67 however.  Blood pressure today is excellent.  She denies any chest pain or shortness of breath her most recent lab work is listed below Lab on 09/27/2022  Component Date Value Ref Range Status   WBC 09/27/2022 7.2  3.8 - 10.8 Thousand/uL Final   RBC 09/27/2022 4.73  3.80 - 5.10 Million/uL Final   Hemoglobin 09/27/2022 14.2  11.7 - 15.5 g/dL Final   HCT 40/98/1191 42.4  35.0 - 45.0 % Final   MCV 09/27/2022 89.6  80.0 - 100.0 fL Final   MCH 09/27/2022 30.0  27.0 - 33.0 pg Final   MCHC 09/27/2022 33.5  32.0 - 36.0 g/dL Final   RDW 47/82/9562 13.1  11.0 - 15.0 % Final   Platelets 09/27/2022 252  140 - 400 Thousand/uL Final   MPV 09/27/2022 10.9  7.5 - 12.5 fL Final   Neutro Abs 09/27/2022 3,362  1,500 - 7,800 cells/uL Final   Lymphs Abs 09/27/2022 3,053  850 - 3,900 cells/uL Final   Absolute Monocytes 09/27/2022 475  200 - 950 cells/uL Final   Eosinophils Absolute 09/27/2022 252  15 - 500 cells/uL Final   Basophils Absolute 09/27/2022 58  0 - 200 cells/uL Final   Neutrophils Relative % 09/27/2022 46.7  % Final   Total Lymphocyte 09/27/2022 42.4  % Final   Monocytes Relative 09/27/2022 6.6  % Final   Eosinophils Relative 09/27/2022 3.5  % Final   Basophils Relative 09/27/2022 0.8  % Final   Glucose, Bld 09/27/2022 120 (H)  65 - 99 mg/dL Final   Comment: .            Fasting reference interval . For someone without known diabetes, a glucose value between 100 and 125 mg/dL is consistent  with prediabetes and should be confirmed with a follow-up test. .    BUN 09/27/2022 19  7 - 25 mg/dL Final   Creat 13/09/6576 0.96  0.50 - 1.05 mg/dL Final   eGFR 46/96/2952 67  > OR = 60 mL/min/1.57m2 Final   BUN/Creatinine Ratio 09/27/2022 SEE NOTE:  6 - 22 (calc) Final   Comment:    Not Reported: BUN and Creatinine are within    reference range. .    Sodium 09/27/2022 137  135 - 146 mmol/L Final   Potassium 09/27/2022 4.5  3.5 - 5.3 mmol/L Final   Chloride 09/27/2022 106  98 - 110 mmol/L Final   CO2 09/27/2022 21  20 - 32 mmol/L Final   Calcium 09/27/2022 9.7  8.6 - 10.4 mg/dL Final   Total Protein 84/13/2440 7.7  6.1 - 8.1 g/dL Final   Albumin 12/05/2534 4.5  3.6 - 5.1 g/dL Final   Globulin 64/40/3474 3.2  1.9 - 3.7 g/dL (calc) Final   AG Ratio 09/27/2022 1.4  1.0 - 2.5 (calc) Final   Total Bilirubin 09/27/2022 0.4  0.2 - 1.2 mg/dL Final   Alkaline phosphatase (APISO) 09/27/2022 70  37 - 153 U/L Final   AST 09/27/2022 20  10 - 35 U/L Final   ALT 09/27/2022 26  6 - 29 U/L Final   Hgb A1c MFr Bld 09/27/2022 6.1 (H)  <5.7 % of total Hgb Final   Comment: For someone without known diabetes, a hemoglobin  A1c value between 5.7% and 6.4% is consistent with prediabetes and should be confirmed with a  follow-up test. . For someone with known diabetes, a value <7% indicates that their diabetes is well controlled. A1c targets should be individualized based on duration of diabetes, age, comorbid conditions, and other considerations. . This assay result is consistent with an increased risk of diabetes. . Currently, no consensus exists regarding use of hemoglobin A1c for diagnosis of diabetes for children. .    Mean Plasma Glucose 09/27/2022 128  mg/dL Final   eAG (mmol/L) 40/98/1191 7.1  mmol/L Final   Comment: . This test was performed on the Roche cobas c503 platform. Effective 11/16/21, a change in test platforms from the Abbott Architect to the Roche cobas c503 may have  shifted HbA1c results compared to historical results. Based on laboratory validation testing conducted at [facility], the Roche platform relative to the Abbott platform had an average increase in HbA1c value of < or = 0.3%. This difference is within accepted  variability established by the [facility]. Note that not all individuals will have had a shift in their results and direct comparisons between historical and current results for testing conducted on different platforms is not recommended.    Cholesterol 09/27/2022 237 (H)  <200 mg/dL Final   HDL 47/82/9562 67  > OR = 50 mg/dL Final   Triglycerides 13/09/6576 81  <150 mg/dL Final   LDL Cholesterol (Calc) 09/27/2022 152 (H)  mg/dL (calc) Final   Comment: Reference range: <100 . Desirable range <100 mg/dL for primary prevention;   <70 mg/dL for patients with CHD or diabetic patients  with > or = 2 CHD risk factors. Marland Kitchen LDL-C is now calculated using the Martin-Hopkins  calculation, which is a validated novel method providing  better accuracy than the Friedewald equation in the  estimation of LDL-C.  Horald Pollen et al. Lenox Ahr. 4696;295(28): 2061-2068  (http://education.[facility].com/faq/FAQ164)    Total CHOL/HDL Ratio 09/27/2022 3.5  <4.1 (calc) Final   Non-HDL Cholesterol (Calc) 09/27/2022 170 (H)  <130 mg/dL (calc) Final   Comment: For patients with diabetes plus 1 major ASCVD risk  factor, treating to a non-HDL-C goal of <100 mg/dL  (LDL-C of <32 mg/dL) is considered a therapeutic  option.    Vit D, 25-Hydroxy 09/27/2022 29 (L)  30 - 100 ng/mL Final   Comment: Vitamin D Status         25-OH Vitamin D: . Deficiency:                    <20 ng/mL Insufficiency:             20 - 29 ng/mL Optimal:                 > or = 30 ng/mL . For 25-OH Vitamin D testing on patients on  D2-supplementation and patients for whom quantitation  of D2 and D3 fractions is required, the  [facility](TM) 25-OH VIT D, (D2,D3), LC/MS/MS is recommended: order  code 44010 (patients >48yrs). . See Note 1 . Note 1 . For additional information, please refer to  http://education.[facility].com/faq/FAQ199  (This link is being provided for informational/  educational purposes only.)    TSH 09/27/2022 4.86 (H)  0.40 - 4.50 mIU/L Final    Immunization History  Administered Date(s) Administered   Hepatitis B 12/02/2005, 01/03/2006, 03/28/2006   IPV 03/10/1999   Influenza,inj,Quad PF,6+ Mos 12/11/2021   Influenza-Unspecified 11/09/2014, 11/09/2015   MMR 07/28/2004   Td 03/10/1999   Tdap 09/05/2014    Past Medical History:  Diagnosis Date   Allergy    Anemia    "as a child"   Arthritis    right knee   Blood transfusion without reported diagnosis    " as a child"   Clotting disorder ([facility])    DVT 1988   Depression    Diabetes mellitus without complication ([facility])    PREDIABETIC   Medical history non-contributory    Varicose veins    Past Surgical History:  Procedure Laterality Date   ATHERECTOMY  02/08/1986   had DVT lt leg during pregnancy-clot removed in [place]   COLONOSCOPY     LASER ABLATION Left    ORIF RADIAL FRACTURE Left 07/27/2012   Procedure: OPEN REDUCTION INTERNAL FIXATION (ORIF) RADIAL FRACTURE;  Surgeon: Wyn Forster., MD;  Location: [facility];  Service: Orthopedics;  Laterality: Left;   TUBAL LIGATION     Current Outpatient Medications on File Prior to Visit  Medication Sig Dispense Refill   cetirizine (ZYRTEC) 10 MG tablet Take 10 mg by mouth as needed.     Cyanocobalamin (VITAMIN B12 PO) Take by mouth daily. TAKING 5000 MCG DAILY     fluticasone (FLONASE) 50 MCG/ACT nasal spray Place into both nostrils daily.     GLUCOSAMINE-CHONDROITIN PO Take 2,000 mg by mouth daily. TAKE 3 CAPSULES DAILY     metFORMIN (GLUCOPHAGE XR) 500 MG 24 hr tablet Take 1 tablet (500 mg total) by mouth daily with breakfast. 90 tablet 3    Multiple Vitamin (MULTIVITAMIN WITH MINERALS) TABS tablet Take 1 tablet by mouth daily.     Omega-3 Fatty Acids (FISH OIL) 1000 MG CAPS daily.     No current facility-administered medications on file prior to visit.   No Known Allergies Social History   Socioeconomic History   Marital status: Married    Spouse name: Not on file   Number of children: Not on file   Years of education: Not on file   Highest education level: Not on file  Occupational History   Not on file  Tobacco Use   Smoking status: Former    Current packs/day: 0.00    Types: Cigarettes    Quit date: 02/09/1980    Years since quitting: 42.6    Passive exposure: Never   Smokeless tobacco: Never  Vaping Use   Vaping status: Never Used  Substance and Sexual Activity   Alcohol use: Yes    Alcohol/week: 0.0 standard drinks of alcohol    Comment: occ   Drug use: No   Sexual activity: Yes    Comment: married, 3 sons.  Other Topics Concern   Not on file  Social History Narrative   Not on file   Social Determinants of Health   Financial Resource Strain: Not on file  Food Insecurity: Not on file  Transportation Needs: Not on file  Physical Activity: Not on file  Stress: Not on file  Social Connections: Not on file  Intimate Partner Violence: Not on file   Family History  Problem Relation Age of Onset   Diabetes Mother    Glaucoma Mother  Hypertension Mother    Diabetes Father    Heart disease Brother 40   Diabetes Brother    Colon cancer Neg Hx    Colon polyps Neg Hx    Esophageal cancer Neg Hx    Rectal cancer Neg Hx    Stomach cancer Neg Hx      Review of Systems     Objective:   Physical Exam Vitals reviewed.  Constitutional:      General: She is not in acute distress.    Appearance: Normal appearance. She is obese. She is not ill-appearing, toxic-appearing or diaphoretic.  HENT:     Head: Normocephalic and atraumatic.  Eyes:     General:        Right eye: No discharge.         Left eye: No discharge.     Extraocular Movements: Extraocular movements intact.     Conjunctiva/sclera: Conjunctivae normal.     Pupils: Pupils are equal, round, and reactive to light.  Neck:     Vascular: No carotid bruit.  Cardiovascular:     Rate and Rhythm: Normal rate and regular rhythm.     Heart sounds: Normal heart sounds. No murmur heard.    No friction rub. No gallop.  Pulmonary:     Effort: Pulmonary effort is normal. No respiratory distress.     Breath sounds: Normal breath sounds. No wheezing, rhonchi or rales.  Abdominal:     General: Abdomen is flat. Bowel sounds are normal. There is no distension.     Palpations: Abdomen is soft. There is no mass.     Tenderness: There is no abdominal tenderness. There is no right CVA tenderness, left CVA tenderness, guarding or rebound.     Hernia: No hernia is present.  Musculoskeletal:     Right lower leg: No edema.     Left lower leg: No edema.  Lymphadenopathy:     Cervical: No cervical adenopathy.  Skin:    General: Skin is warm.     Coloration: Skin is not jaundiced or pale.     Findings: No bruising, erythema, lesion or rash.  Neurological:     General: No focal deficit present.     Mental Status: She is alert and oriented to person, place, and time. Mental status is at baseline.     Cranial Nerves: No cranial nerve deficit.     Sensory: No sensory deficit.     Motor: No weakness.     Coordination: Coordination normal.     Gait: Gait normal.     Deep Tendon Reflexes: Reflexes normal.  Psychiatric:        Mood and Affect: Mood normal.        Behavior: Behavior normal.        Thought Content: Thought content normal.        Judgment: Judgment normal.          Assessment & Plan:  Prediabetes - Plan: CT CARDIAC SCORING (SELF PAY ONLY)  Pure hypercholesterolemia I have prescribed Ozempic 0.25 to 5 mg subcu weekly for prediabetes.  Will see if her insurance will cover that.  I am also concerned about her  hyperlipidemia.  Patient would like to check a coronary artery calcium score before starting a statin given how good her HDL cholesterol is.  She is hesitant to take a statin.  However if there is plaque developing in her coronary arteries she would be willing to start a statin to help prevent progression.  Continue to work on diet exercise and weight loss.  If insurance will cover Ozempic, we will discontinue metformin

## 2022-10-01 ENCOUNTER — Ambulatory Visit (HOSPITAL_COMMUNITY): Payer: 59

## 2022-10-06 ENCOUNTER — Telehealth: Payer: Self-pay

## 2022-10-06 ENCOUNTER — Ambulatory Visit (HOSPITAL_COMMUNITY)
Admission: RE | Admit: 2022-10-06 | Discharge: 2022-10-06 | Disposition: A | Discharge status: Home / Self Care | Payer: 59 | Source: Ambulatory Visit | Attending: Family Medicine | Admitting: Family Medicine

## 2022-10-06 DIAGNOSIS — R7303 Prediabetes: Secondary | ICD-10-CM | POA: Insufficient documentation

## 2022-10-06 NOTE — Telephone Encounter (Signed)
Your request has been approved   Your PA request has been approved. Additional information will be provided in the approval communication. (Message 1145)

## 2022-10-12 ENCOUNTER — Other Ambulatory Visit: Payer: Self-pay

## 2022-10-12 MED ORDER — ROSUVASTATIN CALCIUM 5 MG PO TABS: 5.0000 mg | ORAL_TABLET | Freq: Every day | ORAL | 3 refills | Status: DC

## 2022-10-22 ENCOUNTER — Encounter: Payer: Self-pay | Admitting: Family Medicine

## 2022-10-25 ENCOUNTER — Other Ambulatory Visit: Payer: Self-pay

## 2022-10-25 DIAGNOSIS — R7303 Prediabetes: Secondary | ICD-10-CM

## 2022-10-25 DIAGNOSIS — E7800 Pure hypercholesterolemia, unspecified: Secondary | ICD-10-CM

## 2022-10-25 DIAGNOSIS — E669 Obesity, unspecified: Secondary | ICD-10-CM

## 2022-10-25 DIAGNOSIS — E782 Mixed hyperlipidemia: Secondary | ICD-10-CM

## 2022-10-25 MED ORDER — ROSUVASTATIN CALCIUM 5 MG PO TABS: 5.0000 mg | ORAL_TABLET | Freq: Every day | ORAL | 1 refills | Status: DC

## 2022-10-25 MED ORDER — OZEMPIC (0.25 OR 0.5 MG/DOSE) 2 MG/3ML ~~LOC~~ SOPN
0.5000 mg | PEN_INJECTOR | SUBCUTANEOUS | 1 refills | Status: DC
Start: 2022-10-25 — End: 2022-12-09

## 2022-12-08 ENCOUNTER — Encounter: Payer: Self-pay | Admitting: Family Medicine

## 2022-12-09 ENCOUNTER — Other Ambulatory Visit: Payer: Self-pay

## 2022-12-09 DIAGNOSIS — E782 Mixed hyperlipidemia: Secondary | ICD-10-CM

## 2022-12-09 DIAGNOSIS — E669 Obesity, unspecified: Secondary | ICD-10-CM

## 2022-12-09 DIAGNOSIS — R7303 Prediabetes: Secondary | ICD-10-CM

## 2022-12-09 MED ORDER — SEMAGLUTIDE (1 MG/DOSE) 4 MG/3ML ~~LOC~~ SOPN
1.0000 mg | PEN_INJECTOR | SUBCUTANEOUS | 1 refills | Status: DC
Start: 2022-12-09 — End: 2023-02-01

## 2022-12-10 ENCOUNTER — Other Ambulatory Visit: Payer: 59

## 2022-12-12 ENCOUNTER — Other Ambulatory Visit: Payer: Self-pay | Admitting: Family Medicine

## 2022-12-12 DIAGNOSIS — E7800 Pure hypercholesterolemia, unspecified: Secondary | ICD-10-CM

## 2022-12-12 DIAGNOSIS — E669 Obesity, unspecified: Secondary | ICD-10-CM

## 2022-12-13 NOTE — Telephone Encounter (Signed)
Unable to refill per protocol, Rx expired. Discontinued 12/09/22, dose change.  Requested Prescriptions  Pending Prescriptions Disp Refills   OZEMPIC, 0.25 OR 0.5 MG/DOSE, 2 MG/3ML SOPN [Pharmacy Med Name: OZEMPIC 0.25-0.5 MG/DOSE PEN]  1    Sig: INJECT 0.5 MG INTO THE SKIN ONE TIME PER WEEK     Endocrinology:  Diabetes - GLP-1 Receptor Agonists - semaglutide Failed - 12/12/2022  1:14 AM      Failed - HBA1C in normal range and within 180 days    Hgb A1c MFr Bld  Date Value Ref Range Status  09/27/2022 6.1 (H) <5.7 % of total Hgb Final    Comment:    For someone without known diabetes, a hemoglobin  A1c value between 5.7% and 6.4% is consistent with prediabetes and should be confirmed with a  follow-up test. . For someone with known diabetes, a value <7% indicates that their diabetes is well controlled. A1c targets should be individualized based on duration of diabetes, age, comorbid conditions, and other considerations. . This assay result is consistent with an increased risk of diabetes. . Currently, no consensus exists regarding use of hemoglobin A1c for diagnosis of diabetes for children. .          Failed - Valid encounter within last 6 months    Recent Outpatient Visits           1 year ago Tear of medial meniscus of right knee, unspecified tear type, unspecified whether old or current tear, initial encounter   [facility] Pickard, Priscille Heidelberg, MD   2 years ago Colon cancer screening   [facility] Tanya Nones Priscille Heidelberg, MD   3 years ago Mixed hyperlipidemia   [facility] Tanya Nones, Priscille Heidelberg, MD   4 years ago Mixed hyperlipidemia   [facility] Tanya Nones, Priscille Heidelberg, MD   5 years ago Encounter for hepatitis C screening test for low risk patient   [facility] Donita Brooks, MD              Passed - Cr in normal range and within 360 days    Creat  Date Value Ref Range Status   09/27/2022 0.96 0.50 - 1.05 mg/dL Final

## 2022-12-14 ENCOUNTER — Encounter: Payer: 59 | Admitting: Family Medicine

## 2023-01-30 ENCOUNTER — Other Ambulatory Visit: Payer: Self-pay | Admitting: Family Medicine

## 2023-01-30 DIAGNOSIS — R7303 Prediabetes: Secondary | ICD-10-CM

## 2023-01-30 DIAGNOSIS — E782 Mixed hyperlipidemia: Secondary | ICD-10-CM

## 2023-01-30 DIAGNOSIS — E669 Obesity, unspecified: Secondary | ICD-10-CM

## 2023-02-22 ENCOUNTER — Encounter: Payer: Self-pay | Admitting: Family Medicine

## 2023-03-29 ENCOUNTER — Other Ambulatory Visit: Payer: Self-pay

## 2023-03-29 ENCOUNTER — Encounter: Payer: Self-pay | Admitting: Family Medicine

## 2023-03-29 DIAGNOSIS — E782 Mixed hyperlipidemia: Secondary | ICD-10-CM

## 2023-03-29 DIAGNOSIS — E7800 Pure hypercholesterolemia, unspecified: Secondary | ICD-10-CM

## 2023-03-29 MED ORDER — ROSUVASTATIN CALCIUM 5 MG PO TABS: 5.0000 mg | ORAL_TABLET | Freq: Every day | ORAL | 1 refills | Status: DC

## 2023-04-12 ENCOUNTER — Encounter: Payer: Self-pay | Admitting: Family Medicine

## 2023-04-12 ENCOUNTER — Ambulatory Visit (INDEPENDENT_AMBULATORY_CARE_PROVIDER_SITE_OTHER): Payer: Self-pay | Admitting: Family Medicine

## 2023-04-12 VITALS — BP 120/68 | HR 92 | Temp 98.0°F | Ht 64.0 in | Wt 187.0 lb

## 2023-04-12 DIAGNOSIS — R7303 Prediabetes: Secondary | ICD-10-CM

## 2023-04-12 NOTE — Progress Notes (Signed)
 Wt Readings from Last 3 Encounters:  04/12/23 187 lb (84.8 kg)  09/28/22 203 lb (92.1 kg)  12/11/21 196 lb 6.4 oz (89.1 kg)     Subjective:    Patient ID: Shannon Fitzgerald, female    DOB: [birth year], 63 y.o.   MRN: [id number]  HPI  Patient has a history of prediabetes.  She is currently on Ozempic 1 mg subcu weekly.  She is getting this through a Set designer.  Since starting the medication, she has lost 16 pounds.  She is due to her day to recheck her hemoglobin A1c.  She denies any nausea or vomiting or abdominal pain.   Past Medical History:  Diagnosis Date   Allergy    Anemia    "as a child"   Arthritis    right knee   Blood transfusion without reported diagnosis    " as a child"   Clotting disorder (HCC)    DVT 1988   Depression    Diabetes mellitus without complication (HCC)    PREDIABETIC   Medical history non-contributory    Varicose veins    Past Surgical History:  Procedure Laterality Date   ATHERECTOMY  02/08/1986   had DVT lt leg during pregnancy-clot removed in [place]   COLONOSCOPY     LASER ABLATION Left    ORIF RADIAL FRACTURE Left 07/27/2012   Procedure: OPEN REDUCTION INTERNAL FIXATION (ORIF) RADIAL FRACTURE;  Surgeon: Wyn Forster., MD;  Location: [facility];  Service: Orthopedics;  Laterality: Left;   TUBAL LIGATION     Current Outpatient Medications on File Prior to Visit  Medication Sig Dispense Refill   cetirizine (ZYRTEC) 10 MG tablet Take 10 mg by mouth as needed.     Cyanocobalamin (VITAMIN B12 PO) Take by mouth daily. TAKING 5000 MCG DAILY     Multiple Vitamin (MULTIVITAMIN WITH MINERALS) TABS tablet Take 1 tablet by mouth daily.     Omega-3 Fatty Acids (FISH OIL) 1000 MG CAPS daily.     rosuvastatin (CRESTOR) 5 MG tablet Take 1 tablet (5 mg total) by mouth daily. 90 tablet 1   Semaglutide, 1 MG/DOSE, (OZEMPIC, 1 MG/DOSE,) 4 MG/3ML SOPN INJECT 1 MG ONCE A WEEK AS DIRECTED 3 mL 1   fluticasone (FLONASE) 50 MCG/ACT  nasal spray Place into both nostrils daily. (Patient not taking: Reported on 04/12/2023)     GLUCOSAMINE-CHONDROITIN PO Take 2,000 mg by mouth daily. TAKE 3 CAPSULES DAILY (Patient not taking: Reported on 04/12/2023)     metFORMIN (GLUCOPHAGE XR) 500 MG 24 hr tablet Take 1 tablet (500 mg total) by mouth daily with breakfast. (Patient not taking: Reported on 04/12/2023) 90 tablet 3   No current facility-administered medications on file prior to visit.   No Known Allergies Social History   Socioeconomic History   Marital status: Married    Spouse name: Not on file   Number of children: Not on file   Years of education: Not on file   Highest education level: Not on file  Occupational History   Not on file  Tobacco Use   Smoking status: Former    Current packs/day: 0.00    Types: Cigarettes    Quit date: 02/09/1980    Years since quitting: 43.2    Passive exposure: Never   Smokeless tobacco: Never  Vaping Use   Vaping status: Never Used  Substance and Sexual Activity   Alcohol use: Yes    Alcohol/week: 0.0 standard drinks of alcohol  Comment: occ   Drug use: No   Sexual activity: Yes    Comment: married, 3 sons.  Other Topics Concern   Not on file  Social History Narrative   Not on file   Social Drivers of Health   Financial Resource Strain: Not on file  Food Insecurity: Not on file  Transportation Needs: Not on file  Physical Activity: Not on file  Stress: Not on file  Social Connections: Not on file  Intimate Partner Violence: Not on file   Family History  Problem Relation Age of Onset   Diabetes Mother    Glaucoma Mother    Hypertension Mother    Diabetes Father    Heart disease Brother 70   Diabetes Brother    Colon cancer Neg Hx    Colon polyps Neg Hx    Esophageal cancer Neg Hx    Rectal cancer Neg Hx    Stomach cancer Neg Hx      Review of Systems     Objective:   Physical Exam Vitals reviewed.  Constitutional:      General: She is not in acute  distress.    Appearance: Normal appearance. She is obese. She is not ill-appearing, toxic-appearing or diaphoretic.  HENT:     Head: Normocephalic and atraumatic.  Eyes:     General:        Right eye: No discharge.        Left eye: No discharge.     Extraocular Movements: Extraocular movements intact.     Conjunctiva/sclera: Conjunctivae normal.     Pupils: Pupils are equal, round, and reactive to light.  Neck:     Vascular: No carotid bruit.  Cardiovascular:     Rate and Rhythm: Normal rate and regular rhythm.     Heart sounds: Normal heart sounds. No murmur heard.    No friction rub. No gallop.  Pulmonary:     Effort: Pulmonary effort is normal. No respiratory distress.     Breath sounds: Normal breath sounds. No wheezing, rhonchi or rales.  Abdominal:     General: Abdomen is flat. Bowel sounds are normal. There is no distension.     Palpations: Abdomen is soft. There is no mass.     Tenderness: There is no abdominal tenderness. There is no right CVA tenderness, left CVA tenderness, guarding or rebound.     Hernia: No hernia is present.  Musculoskeletal:     Right lower leg: No edema.     Left lower leg: No edema.  Lymphadenopathy:     Cervical: No cervical adenopathy.  Skin:    General: Skin is warm.     Coloration: Skin is not jaundiced or pale.     Findings: No bruising, erythema, lesion or rash.  Neurological:     General: No focal deficit present.     Mental Status: She is alert and oriented to person, place, and time. Mental status is at baseline.     Cranial Nerves: No cranial nerve deficit.     Sensory: No sensory deficit.     Motor: No weakness.     Coordination: Coordination normal.     Gait: Gait normal.     Deep Tendon Reflexes: Reflexes normal.  Psychiatric:        Mood and Affect: Mood normal.        Behavior: Behavior normal.        Thought Content: Thought content normal.        Judgment: Judgment normal.  Assessment & Plan:   Prediabetes - Plan: COMPLETE METABOLIC PANEL WITH GFR, Hemoglobin A1c Increase Ozempic to 1.5 mg subcu weekly.  The patient is getting this through a compounding pharmacy so she will be taking 60 units weekly.  Gave the patient a prescription for this at a 90-day supply per her request.  Monitor for any nausea or abdominal pain.  Check CMP and hemoglobin A1c today

## 2023-04-13 LAB — COMPLETE METABOLIC PANEL WITH GFR
AG Ratio: 1.4 (calc) (ref 1.0–2.5)
ALT: 26 U/L (ref 6–29)
AST: 20 U/L (ref 10–35)
Albumin: 4.4 g/dL (ref 3.6–5.1)
Alkaline phosphatase (APISO): 70 U/L (ref 37–153)
BUN: 20 mg/dL (ref 7–25)
CO2: 26 mmol/L (ref 20–32)
Calcium: 9.7 mg/dL (ref 8.6–10.4)
Chloride: 106 mmol/L (ref 98–110)
Creat: 0.89 mg/dL (ref 0.50–1.05)
Globulin: 3.2 g/dL (ref 1.9–3.7)
Glucose, Bld: 112 mg/dL — ABNORMAL HIGH (ref 65–99)
Potassium: 4.4 mmol/L (ref 3.5–5.3)
Sodium: 140 mmol/L (ref 135–146)
Total Bilirubin: 0.3 mg/dL (ref 0.2–1.2)
Total Protein: 7.6 g/dL (ref 6.1–8.1)
eGFR: 73 mL/min/{1.73_m2} (ref >=60)

## 2023-04-13 LAB — HEMOGLOBIN A1C
Hgb A1c MFr Bld: 5.9 %{Hb} — ABNORMAL HIGH (ref <5.7)
Mean Plasma Glucose: 123 mg/dL
eAG (mmol/L): 6.8 mmol/L

## 2023-08-05 DIAGNOSIS — Z01419 Encounter for gynecological examination (general) (routine) without abnormal findings: Secondary | ICD-10-CM | POA: Diagnosis not present

## 2023-08-05 DIAGNOSIS — R7303 Prediabetes: Secondary | ICD-10-CM | POA: Insufficient documentation

## 2023-08-05 DIAGNOSIS — Z124 Encounter for screening for malignant neoplasm of cervix: Secondary | ICD-10-CM | POA: Diagnosis not present

## 2023-08-15 DIAGNOSIS — Z1231 Encounter for screening mammogram for malignant neoplasm of breast: Secondary | ICD-10-CM | POA: Diagnosis not present

## 2023-08-26 DIAGNOSIS — R87610 Atypical squamous cells of undetermined significance on cytologic smear of cervix (ASC-US): Secondary | ICD-10-CM | POA: Insufficient documentation

## 2023-08-29 ENCOUNTER — Other Ambulatory Visit: Payer: Self-pay | Admitting: Family Medicine

## 2023-08-29 DIAGNOSIS — E782 Mixed hyperlipidemia: Secondary | ICD-10-CM

## 2023-08-29 DIAGNOSIS — E7800 Pure hypercholesterolemia, unspecified: Secondary | ICD-10-CM

## 2023-08-31 NOTE — Telephone Encounter (Signed)
 Requested medication (s) are due for refill today:   Yes  Requested medication (s) are on the active medication list:   Yes  Future visit scheduled:   Yes 8/18     LOV 04/12/2023   Last ordered: 03/29/2023 #90, 1 refill  Returned because lipid panel is due in Aug., otherwise all criteria met.    Has upcoming appt.     Requested Prescriptions  Pending Prescriptions Disp Refills   rosuvastatin  (CRESTOR ) 5 MG tablet [Pharmacy Med Name: ROSUVASTATIN  CALCIUM  5MG  TABS] 90 tablet 1    Sig: TAKE ONE TABLET BY MOUTH EVERY DAY     Cardiovascular:  Antilipid - Statins 2 Failed - 08/31/2023  7:47 AM      Failed - Lipid Panel in normal range within the last 12 months    Cholesterol  Date Value Ref Range Status  09/27/2022 237 (H) <200 mg/dL Final   LDL Cholesterol (Calc)  Date Value Ref Range Status  09/27/2022 152 (H) mg/dL (calc) Final    Comment:    Reference range: <100 . Desirable range <100 mg/dL for primary prevention;   <70 mg/dL for patients with CHD or diabetic patients  with > or = 2 CHD risk factors. SABRA LDL-C is now calculated using the Martin-Hopkins  calculation, which is a validated novel method providing  better accuracy than the Friedewald equation in the  estimation of LDL-C.  Gladis APPLETHWAITE et al. SANDREA. 7986;689(80): 2061-2068  (http://education.[facility].com/faq/FAQ164)    HDL  Date Value Ref Range Status  09/27/2022 67 > OR = 50 mg/dL Final   Triglycerides  Date Value Ref Range Status  09/27/2022 81 <150 mg/dL Final         Passed - Cr in normal range and within 360 days    Creat  Date Value Ref Range Status  04/12/2023 0.89 0.50 - 1.05 mg/dL Final         Passed - Patient is not pregnant      Passed - Valid encounter within last 12 months    Recent Outpatient Visits           4 months ago Prediabetes   [facility] Pickard, Butler DASEN, MD   11 months ago Prediabetes   [employer] [facility] Duanne,  Butler DASEN, MD   1 year ago Routine general medical examination at a health care facility   [facility] Duanne Butler DASEN, MD   11 years ago Left wrist pain   [facility], Thao P, DO

## 2023-09-23 ENCOUNTER — Other Ambulatory Visit

## 2023-09-23 DIAGNOSIS — E7800 Pure hypercholesterolemia, unspecified: Secondary | ICD-10-CM

## 2023-09-23 DIAGNOSIS — E119 Type 2 diabetes mellitus without complications: Secondary | ICD-10-CM | POA: Diagnosis not present

## 2023-09-23 DIAGNOSIS — E1169 Type 2 diabetes mellitus with other specified complication: Secondary | ICD-10-CM | POA: Diagnosis not present

## 2023-09-23 DIAGNOSIS — Z0000 Encounter for general adult medical examination without abnormal findings: Secondary | ICD-10-CM | POA: Diagnosis not present

## 2023-09-24 LAB — HEMOGLOBIN A1C
Hgb A1c MFr Bld: 5.8 % — ABNORMAL HIGH (ref <5.7)
Mean Plasma Glucose: 120 mg/dL
eAG (mmol/L): 6.6 mmol/L

## 2023-09-24 LAB — COMPLETE METABOLIC PANEL WITHOUT GFR
AG Ratio: 1.5 (calc) (ref 1.0–2.5)
ALT: 52 U/L — ABNORMAL HIGH (ref 6–29)
AST: 36 U/L — ABNORMAL HIGH (ref 10–35)
Albumin: 4.4 g/dL (ref 3.6–5.1)
Alkaline phosphatase (APISO): 74 U/L (ref 37–153)
BUN: 19 mg/dL (ref 7–25)
CO2: 27 mmol/L (ref 20–32)
Calcium: 9.4 mg/dL (ref 8.6–10.4)
Chloride: 104 mmol/L (ref 98–110)
Creat: 0.94 mg/dL (ref 0.50–1.05)
Globulin: 3 g/dL (ref 1.9–3.7)
Glucose, Bld: 93 mg/dL (ref 65–99)
Potassium: 4.5 mmol/L (ref 3.5–5.3)
Sodium: 139 mmol/L (ref 135–146)
Total Bilirubin: 0.4 mg/dL (ref 0.2–1.2)
Total Protein: 7.4 g/dL (ref 6.1–8.1)

## 2023-09-24 LAB — LIPID PANEL
Cholesterol: 177 mg/dL (ref <200)
HDL: 67 mg/dL (ref >=50)
LDL Cholesterol (Calc): 92 mg/dL
Non-HDL Cholesterol (Calc): 110 mg/dL (ref <130)
Total CHOL/HDL Ratio: 2.6 (calc) (ref <5.0)
Triglycerides: 89 mg/dL (ref <150)

## 2023-09-24 LAB — TSH: TSH: 3.64 m[IU]/L (ref 0.40–4.50)

## 2023-09-26 ENCOUNTER — Ambulatory Visit: Admitting: Family Medicine

## 2023-09-26 ENCOUNTER — Encounter: Payer: Self-pay | Admitting: Family Medicine

## 2023-09-26 ENCOUNTER — Ambulatory Visit: Payer: Self-pay | Admitting: Family Medicine

## 2023-09-26 VITALS — BP 132/68 | HR 65 | Temp 98.2°F | Ht 64.0 in | Wt 180.8 lb

## 2023-09-26 DIAGNOSIS — E7800 Pure hypercholesterolemia, unspecified: Secondary | ICD-10-CM | POA: Diagnosis not present

## 2023-09-26 DIAGNOSIS — E669 Obesity, unspecified: Secondary | ICD-10-CM

## 2023-09-26 DIAGNOSIS — Z0000 Encounter for general adult medical examination without abnormal findings: Secondary | ICD-10-CM

## 2023-09-26 DIAGNOSIS — Z0001 Encounter for general adult medical examination with abnormal findings: Secondary | ICD-10-CM

## 2023-09-26 DIAGNOSIS — Z23 Encounter for immunization: Secondary | ICD-10-CM

## 2023-09-26 DIAGNOSIS — Z7984 Long term (current) use of oral hypoglycemic drugs: Secondary | ICD-10-CM

## 2023-09-26 DIAGNOSIS — R7303 Prediabetes: Secondary | ICD-10-CM

## 2023-09-26 NOTE — Progress Notes (Signed)
 Subjective:    Patient ID: Shannon Fitzgerald, female    DOB: [birth year], 63 y.o.   MRN: [id number]  HPI Patient is here today for complete physical exam.  Patient had a colonoscopy in 2023.  Due to the presence of several tubular adenomas they recommended a repeat colonoscopy in 2026.  She sees her gynecologist.  They performed a Pap smear earlier this year.  They plan to repeat this again next year due to some atypical cells.  She also had a mammogram earlier this year.  She is due for the pneumonia vaccine.  She has had the shingles vaccine.  Her tetanus shot is due next year.  Her most recent lab work is listed below Lab on 09/23/2023  Component Date Value Ref Range Status   Hgb A1c MFr Bld 09/23/2023 5.8 (H)  <5.7 % Final   Comment: For someone without known diabetes, a hemoglobin  A1c value between 5.7% and 6.4% is consistent with prediabetes and should be confirmed with a  follow-up test. . For someone with known diabetes, a value <7% indicates that their diabetes is well controlled. A1c targets should be individualized based on duration of diabetes, age, comorbid conditions, and other considerations. . This assay result is consistent with an increased risk of diabetes. . Currently, no consensus exists regarding use of hemoglobin A1c for diagnosis of diabetes for children. .    Mean Plasma Glucose 09/23/2023 120  mg/dL Final   eAG (mmol/L) 91/84/7974 6.6  mmol/L Final   Glucose, Bld 09/23/2023 93  65 - 99 mg/dL Final   Comment: .            Fasting reference interval .    BUN 09/23/2023 19  7 - 25 mg/dL Final   Creat 91/84/7974 0.94  0.50 - 1.05 mg/dL Final   BUN/Creatinine Ratio 09/23/2023 SEE NOTE:  6 - 22 (calc) Final   Comment:    Not Reported: BUN and Creatinine are within    reference range. .    Sodium 09/23/2023 139  135 - 146 mmol/L Final   Potassium 09/23/2023 4.5  3.5 - 5.3 mmol/L Final   Chloride 09/23/2023 104  98 - 110 mmol/L Final   CO2 09/23/2023 27   20 - 32 mmol/L Final   Calcium  09/23/2023 9.4  8.6 - 10.4 mg/dL Final   Total Protein 91/84/7974 7.4  6.1 - 8.1 g/dL Final   Albumin 91/84/7974 4.4  3.6 - 5.1 g/dL Final   Globulin 91/84/7974 3.0  1.9 - 3.7 g/dL (calc) Final   AG Ratio 09/23/2023 1.5  1.0 - 2.5 (calc) Final   Total Bilirubin 09/23/2023 0.4  0.2 - 1.2 mg/dL Final   Alkaline phosphatase (APISO) 09/23/2023 74  37 - 153 U/L Final   AST 09/23/2023 36 (H)  10 - 35 U/L Final   ALT 09/23/2023 52 (H)  6 - 29 U/L Final   TSH 09/23/2023 3.64  0.40 - 4.50 mIU/L Final   Cholesterol 09/23/2023 177  <200 mg/dL Final   HDL 91/84/7974 67  > OR = 50 mg/dL Final   Triglycerides 91/84/7974 89  <150 mg/dL Final   LDL Cholesterol (Calc) 09/23/2023 92  mg/dL (calc) Final   Comment: Reference range: <100 . Desirable range <100 mg/dL for primary prevention;   <70 mg/dL for patients with CHD or diabetic patients  with > or = 2 CHD risk factors. SABRA LDL-C is now calculated using the Martin-Hopkins  calculation, which is a validated novel method providing  better accuracy than the Friedewald equation in the  estimation of LDL-C.  Gladis APPLETHWAITE et al. SANDREA. 7986;689(80): 2061-2068  (http://education.[facility].com/faq/FAQ164)    Total CHOL/HDL Ratio 09/23/2023 2.6  <4.9 (calc) Final   Non-HDL Cholesterol (Calc) 09/23/2023 110  <130 mg/dL (calc) Final   Comment: For patients with diabetes plus 1 major ASCVD risk  factor, treating to a non-HDL-C goal of <100 mg/dL  (LDL-C of <29 mg/dL) is considered a therapeutic  option.     Immunization History  Administered Date(s) Administered   Hepatitis B 12/02/2005, 01/03/2006, 03/28/2006   IPV 03/10/1999   Influenza,inj,Quad PF,6+ Mos 12/11/2021   Influenza-Unspecified 11/09/2014, 11/09/2015   MMR 07/28/2004   Td 03/10/1999   Tdap 09/05/2014   Zoster Recombinant(Shingrix) 09/28/2022, 12/02/2022    Past Medical History:  Diagnosis Date   Allergy    Anemia    as a child   Arthritis     right knee   Blood transfusion without reported diagnosis     as a child   Clotting disorder (HCC)    DVT 1988   Depression    Diabetes mellitus without complication (HCC)    PREDIABETIC   Medical history non-contributory    Varicose veins    Past Surgical History:  Procedure Laterality Date   ATHERECTOMY  02/08/1986   had DVT lt leg during pregnancy-clot removed in [place]   COLONOSCOPY     LASER ABLATION Left    ORIF RADIAL FRACTURE Left 07/27/2012   Procedure: OPEN REDUCTION INTERNAL FIXATION (ORIF) RADIAL FRACTURE;  Surgeon: Lamar LULLA Leonor Mickey., MD;  Location: [facility];  Service: Orthopedics;  Laterality: Left;   TUBAL LIGATION     Current Outpatient Medications on File Prior to Visit  Medication Sig Dispense Refill   Multiple Vitamin (MULTIVITAMIN WITH MINERALS) TABS tablet Take 1 tablet by mouth daily.     Omega-3 Fatty Acids (FISH OIL) 1000 MG CAPS daily.     rosuvastatin  (CRESTOR ) 5 MG tablet TAKE ONE TABLET BY MOUTH EVERY DAY 90 tablet 1   Semaglutide , 1 MG/DOSE, (OZEMPIC , 1 MG/DOSE,) 4 MG/3ML SOPN INJECT 1 MG ONCE A WEEK AS DIRECTED 3 mL 1   cetirizine (ZYRTEC) 10 MG tablet Take 10 mg by mouth as needed. (Patient not taking: Reported on 09/26/2023)     Cyanocobalamin (VITAMIN B12 PO) Take by mouth daily. TAKING 5000 MCG DAILY (Patient not taking: Reported on 09/26/2023)     fluticasone (FLONASE) 50 MCG/ACT nasal spray Place into both nostrils daily. (Patient not taking: Reported on 04/12/2023)     GLUCOSAMINE-CHONDROITIN PO Take 2,000 mg by mouth daily. TAKE 3 CAPSULES DAILY (Patient not taking: Reported on 04/12/2023)     metFORMIN  (GLUCOPHAGE  XR) 500 MG 24 hr tablet Take 1 tablet (500 mg total) by mouth daily with breakfast. (Patient not taking: Reported on 04/12/2023) 90 tablet 3   No current facility-administered medications on file prior to visit.   No Known Allergies Social History   Socioeconomic History   Marital status: Married    Spouse name: Not  on file   Number of children: Not on file   Years of education: Not on file   Highest education level: Not on file  Occupational History   Not on file  Tobacco Use   Smoking status: Former    Current packs/day: 0.00    Types: Cigarettes    Quit date: 02/09/1980    Years since quitting: 43.6    Passive exposure: Never   Smokeless tobacco: Never  Vaping Use   Vaping status: Never Used  Substance and Sexual Activity   Alcohol use: Yes    Alcohol/week: 0.0 standard drinks of alcohol    Comment: occ   Drug use: No   Sexual activity: Yes    Comment: married, 3 sons.  Other Topics Concern   Not on file  Social History Narrative   Not on file   Social Drivers of Health   Financial Resource Strain: Not on file  Food Insecurity: Not on file  Transportation Needs: Not on file  Physical Activity: Not on file  Stress: Not on file  Social Connections: Not on file  Intimate Partner Violence: Not on file   Family History  Problem Relation Age of Onset   Diabetes Mother    Glaucoma Mother    Hypertension Mother    Diabetes Father    Heart disease Brother 65   Diabetes Brother    Colon cancer Neg Hx    Colon polyps Neg Hx    Esophageal cancer Neg Hx    Rectal cancer Neg Hx    Stomach cancer Neg Hx      Review of Systems     Objective:   Physical Exam Vitals reviewed.  Constitutional:      General: She is not in acute distress.    Appearance: Normal appearance. She is obese. She is not ill-appearing, toxic-appearing or diaphoretic.  HENT:     Head: Normocephalic and atraumatic.     Right Ear: Tympanic membrane and ear canal normal.     Left Ear: Tympanic membrane and ear canal normal.     Nose: Nose normal. No congestion.     Mouth/Throat:     Mouth: Mucous membranes are moist.     Pharynx: Oropharynx is clear. No oropharyngeal exudate or posterior oropharyngeal erythema.  Eyes:     General:        Right eye: No discharge.        Left eye: No discharge.      Extraocular Movements: Extraocular movements intact.     Conjunctiva/sclera: Conjunctivae normal.     Pupils: Pupils are equal, round, and reactive to light.  Neck:     Vascular: No carotid bruit.  Cardiovascular:     Rate and Rhythm: Normal rate and regular rhythm.     Heart sounds: Normal heart sounds. No murmur heard.    No friction rub. No gallop.  Pulmonary:     Effort: Pulmonary effort is normal. No respiratory distress.     Breath sounds: Normal breath sounds. No wheezing, rhonchi or rales.  Abdominal:     General: Abdomen is flat. Bowel sounds are normal. There is no distension.     Palpations: Abdomen is soft. There is no mass.     Tenderness: There is no abdominal tenderness. There is no right CVA tenderness, left CVA tenderness, guarding or rebound.     Hernia: No hernia is present.  Musculoskeletal:        General: No swelling, tenderness, deformity or signs of injury.     Cervical back: Normal range of motion and neck supple.     Right lower leg: No edema.     Left lower leg: No edema.  Lymphadenopathy:     Cervical: No cervical adenopathy.  Skin:    General: Skin is warm.     Coloration: Skin is not jaundiced or pale.     Findings: No bruising, erythema, lesion or rash.  Neurological:  General: No focal deficit present.     Mental Status: She is alert and oriented to person, place, and time. Mental status is at baseline.     Cranial Nerves: No cranial nerve deficit.     Sensory: No sensory deficit.     Motor: No weakness.     Coordination: Coordination normal.     Gait: Gait normal.     Deep Tendon Reflexes: Reflexes normal.  Psychiatric:        Mood and Affect: Mood normal.        Behavior: Behavior normal.        Thought Content: Thought content normal.        Judgment: Judgment normal.           Assessment & Plan:  Need for vaccination - Plan: Pneumococcal conjugate vaccine 20-valent (Prevnar 20)  Pure  hypercholesterolemia  Prediabetes  Obesity with body mass index 30 or greater  Routine general medical examination at a health care facility Cholesterol is much better.  I am very proud of the patient for losing weight.  Will try Zepbound if her insurance will tolerate it.  Her liver tests are elevated.  We have decided to hold rosuvastatin  and recheck cholesterol and liver function test in 6 weeks to see if the elevated liver function test are related to the rosuvastatin .  Her blood pressure and prediabetes is well-controlled.  Patient received Prevnar 20 today.  The remainder of her vaccines are up-to-date.  Her colonoscopy is not due until next year.  Her tetanus shot is not due till next year.  I defer her mammogram and Pap smear to her gynecologist.  Her bone density test will be due at age 20.  I did counsel the patient to use calcium  and vitamin D 

## 2023-10-20 DIAGNOSIS — M9903 Segmental and somatic dysfunction of lumbar region: Secondary | ICD-10-CM | POA: Diagnosis not present

## 2023-10-20 DIAGNOSIS — M62830 Muscle spasm of back: Secondary | ICD-10-CM | POA: Diagnosis not present

## 2023-10-20 DIAGNOSIS — M9901 Segmental and somatic dysfunction of cervical region: Secondary | ICD-10-CM | POA: Diagnosis not present

## 2023-10-20 DIAGNOSIS — M545 Low back pain, unspecified: Secondary | ICD-10-CM | POA: Diagnosis not present

## 2023-10-25 DIAGNOSIS — M545 Low back pain, unspecified: Secondary | ICD-10-CM | POA: Diagnosis not present

## 2023-10-25 DIAGNOSIS — M62830 Muscle spasm of back: Secondary | ICD-10-CM | POA: Diagnosis not present

## 2023-10-25 DIAGNOSIS — M9901 Segmental and somatic dysfunction of cervical region: Secondary | ICD-10-CM | POA: Diagnosis not present

## 2023-10-25 DIAGNOSIS — M9903 Segmental and somatic dysfunction of lumbar region: Secondary | ICD-10-CM | POA: Diagnosis not present

## 2023-10-26 DIAGNOSIS — M9903 Segmental and somatic dysfunction of lumbar region: Secondary | ICD-10-CM | POA: Diagnosis not present

## 2023-10-26 DIAGNOSIS — M62830 Muscle spasm of back: Secondary | ICD-10-CM | POA: Diagnosis not present

## 2023-10-26 DIAGNOSIS — M9901 Segmental and somatic dysfunction of cervical region: Secondary | ICD-10-CM | POA: Diagnosis not present

## 2023-10-26 DIAGNOSIS — M545 Low back pain, unspecified: Secondary | ICD-10-CM | POA: Diagnosis not present

## 2023-10-27 DIAGNOSIS — M62830 Muscle spasm of back: Secondary | ICD-10-CM | POA: Diagnosis not present

## 2023-10-27 DIAGNOSIS — M9903 Segmental and somatic dysfunction of lumbar region: Secondary | ICD-10-CM | POA: Diagnosis not present

## 2023-10-27 DIAGNOSIS — M545 Low back pain, unspecified: Secondary | ICD-10-CM | POA: Diagnosis not present

## 2023-10-27 DIAGNOSIS — M9901 Segmental and somatic dysfunction of cervical region: Secondary | ICD-10-CM | POA: Diagnosis not present

## 2023-10-31 DIAGNOSIS — M62830 Muscle spasm of back: Secondary | ICD-10-CM | POA: Diagnosis not present

## 2023-10-31 DIAGNOSIS — M545 Low back pain, unspecified: Secondary | ICD-10-CM | POA: Diagnosis not present

## 2023-10-31 DIAGNOSIS — M9903 Segmental and somatic dysfunction of lumbar region: Secondary | ICD-10-CM | POA: Diagnosis not present

## 2023-10-31 DIAGNOSIS — M9901 Segmental and somatic dysfunction of cervical region: Secondary | ICD-10-CM | POA: Diagnosis not present

## 2023-11-02 DIAGNOSIS — M545 Low back pain, unspecified: Secondary | ICD-10-CM | POA: Diagnosis not present

## 2023-11-02 DIAGNOSIS — M62830 Muscle spasm of back: Secondary | ICD-10-CM | POA: Diagnosis not present

## 2023-11-02 DIAGNOSIS — M9903 Segmental and somatic dysfunction of lumbar region: Secondary | ICD-10-CM | POA: Diagnosis not present

## 2023-11-02 DIAGNOSIS — M9901 Segmental and somatic dysfunction of cervical region: Secondary | ICD-10-CM | POA: Diagnosis not present

## 2023-11-10 DIAGNOSIS — M25561 Pain in right knee: Secondary | ICD-10-CM | POA: Diagnosis not present

## 2023-11-10 DIAGNOSIS — M9901 Segmental and somatic dysfunction of cervical region: Secondary | ICD-10-CM | POA: Diagnosis not present

## 2023-11-10 DIAGNOSIS — M9903 Segmental and somatic dysfunction of lumbar region: Secondary | ICD-10-CM | POA: Diagnosis not present

## 2023-11-10 DIAGNOSIS — M545 Low back pain, unspecified: Secondary | ICD-10-CM | POA: Diagnosis not present

## 2023-11-10 DIAGNOSIS — M62830 Muscle spasm of back: Secondary | ICD-10-CM | POA: Diagnosis not present

## 2023-11-10 DIAGNOSIS — M62838 Other muscle spasm: Secondary | ICD-10-CM | POA: Diagnosis not present

## 2024-09-24 ENCOUNTER — Other Ambulatory Visit

## 2024-09-27 ENCOUNTER — Encounter: Admitting: Family Medicine
